# Patient Record
Sex: Male | Born: 1935 | Race: White | Hispanic: No | Marital: Married | Smoking: Former smoker
Health system: Southern US, Community
[De-identification: ages and names within clinical notes are randomized; demographics above are authoritative.]

## PROBLEM LIST (undated history)

## (undated) DIAGNOSIS — G4730 Sleep apnea, unspecified: Secondary | ICD-10-CM

## (undated) DIAGNOSIS — K469 Unspecified abdominal hernia without obstruction or gangrene: Secondary | ICD-10-CM

## (undated) DIAGNOSIS — R413 Other amnesia: Secondary | ICD-10-CM

## (undated) DIAGNOSIS — H269 Unspecified cataract: Secondary | ICD-10-CM

## (undated) DIAGNOSIS — K635 Polyp of colon: Secondary | ICD-10-CM

## (undated) DIAGNOSIS — N400 Enlarged prostate without lower urinary tract symptoms: Secondary | ICD-10-CM

## (undated) HISTORY — PX: UPPER GASTROINTESTINAL ENDOSCOPY: SHX188

## (undated) HISTORY — DX: Unspecified cataract: H26.9

## (undated) HISTORY — PX: POLYPECTOMY: SHX149

## (undated) HISTORY — PX: EYE SURGERY: SHX253

## (undated) HISTORY — DX: Polyp of colon: K63.5

## (undated) HISTORY — PX: LIPOMA EXCISION: SHX5283

## (undated) HISTORY — PX: COLONOSCOPY: SHX174

## (undated) HISTORY — PX: OTHER SURGICAL HISTORY: SHX169

## (undated) HISTORY — DX: Other amnesia: R41.3

## (undated) HISTORY — PX: TONSILLECTOMY: SUR1361

---

## 2006-04-04 ENCOUNTER — Encounter: Admission: RE | Admit: 2006-04-04 | Discharge: 2006-04-04 | Payer: Self-pay | Admitting: Internal Medicine

## 2008-02-02 ENCOUNTER — Ambulatory Visit (HOSPITAL_COMMUNITY): Admission: RE | Admit: 2008-02-02 | Discharge: 2008-02-02 | Payer: Self-pay | Admitting: *Deleted

## 2009-03-31 ENCOUNTER — Encounter: Admission: RE | Admit: 2009-03-31 | Discharge: 2009-03-31 | Payer: Self-pay | Admitting: Internal Medicine

## 2010-10-17 NOTE — Op Note (Signed)
NAME:  Gregory Lamb, Gregory Lamb NO.:  [id number]   MEDICAL RECORD NO.:  [id number]          PATIENT TYPE:  AMB   LOCATION:  ENDO                         FACILITY:  [facility]   PHYSICIAN:  Georgiana Spinner, M.D.    DATE OF BIRTH:  [birth year]   DATE OF PROCEDURE:  DATE OF DISCHARGE:                               OPERATIVE REPORT   Mr. Gregory Lamb is a 77 year old gentleman undergoing colonoscopy for follow-  up of colon polyps.   ANESTHESIA:  Fentanyl 75 mcg, Versed 6 mg.   PROCEDURE:  With the patient mildly sedated in the left lateral  decubitus position the Pentax videoscopic colonoscope was inserted in  the rectum after a normal rectal exam was performed and passed under  direct vision to the cecum, identified by ileocecal valve and  appendiceal orifice.    Dictation ended at this point.           ______________________________  Georgiana Spinner, M.D.     GMO/MEDQ  D:  02/02/2008  T:  02/02/2008  Job:  [id number]

## 2010-10-20 NOTE — Op Note (Signed)
NAME:  Gregory Lamb, Gregory Lamb NO.:  [id number]   MEDICAL RECORD NO.:  [id number]          PATIENT TYPE:  AMB   LOCATION:  ENDO                         FACILITY:  [facility]   PHYSICIAN:  Georgiana Spinner, M.D.    DATE OF BIRTH:  12/04/1933   DATE OF PROCEDURE:  DATE OF DISCHARGE:  02/02/2008                               OPERATIVE REPORT   PROCEDURE:  Colonoscopy.   INDICATIONS:  Colon polyps.   ANESTHESIA:  Fentanyl 75 mcg, Versed 6 mg.   PROCEDURE:  With the patient mildly sedated in the left lateral  decubitus position, the Pentax videoscopic colonoscope was inserted in  the rectum after normal rectal exam was performed, passed under direct  vision to the cecum identified by ileocecal valve and appendiceal  orifice.  From this point the colonoscope was slowly withdrawn taking  circumferential views of colonic mucosa stopping in the rectum which  appeared normal on direct and showed hemorrhoids on retroflexed view.  The endoscope was straightened and withdrawn.  The patient's vital signs  and pulse oximeter remained stable.  The patient tolerated procedure  well without apparent complications.   FINDINGS:  Internal hemorrhoids, otherwise an unremarkable examination.   PLAN:  Have patient follow-up with me as needed.           ______________________________  Georgiana Spinner, M.D.     GMO/MEDQ  D:  02/20/2008  T:  02/23/2008  Job:  [id number]

## 2010-12-27 ENCOUNTER — Ambulatory Visit
Admission: RE | Admit: 2010-12-27 | Discharge: 2010-12-27 | Disposition: A | Discharge status: Home / Self Care | Payer: Medicare Other | Source: Ambulatory Visit | Attending: Internal Medicine | Admitting: Internal Medicine

## 2010-12-27 ENCOUNTER — Other Ambulatory Visit: Payer: Self-pay | Admitting: Internal Medicine

## 2010-12-27 DIAGNOSIS — N50812 Left testicular pain: Secondary | ICD-10-CM

## 2011-11-26 ENCOUNTER — Other Ambulatory Visit: Payer: Self-pay | Admitting: Urology

## 2011-12-04 ENCOUNTER — Encounter (HOSPITAL_COMMUNITY): Payer: Self-pay | Admitting: Pharmacy Technician

## 2011-12-10 ENCOUNTER — Encounter (HOSPITAL_COMMUNITY): Payer: Self-pay

## 2011-12-10 ENCOUNTER — Encounter (HOSPITAL_COMMUNITY)
Admission: RE | Admit: 2011-12-10 | Discharge: 2011-12-10 | Disposition: A | Discharge status: Home / Self Care | Payer: Medicare Other | Source: Ambulatory Visit | Attending: Urology | Admitting: Urology

## 2011-12-10 ENCOUNTER — Ambulatory Visit (HOSPITAL_COMMUNITY)
Admission: RE | Admit: 2011-12-10 | Discharge: 2011-12-10 | Disposition: A | Discharge status: Home / Self Care | Payer: Medicare Other | Source: Ambulatory Visit | Attending: Urology | Admitting: Urology

## 2011-12-10 DIAGNOSIS — Z01818 Encounter for other preprocedural examination: Secondary | ICD-10-CM | POA: Insufficient documentation

## 2011-12-10 DIAGNOSIS — G4733 Obstructive sleep apnea (adult) (pediatric): Secondary | ICD-10-CM | POA: Insufficient documentation

## 2011-12-10 DIAGNOSIS — E119 Type 2 diabetes mellitus without complications: Secondary | ICD-10-CM | POA: Insufficient documentation

## 2011-12-10 HISTORY — DX: Unspecified abdominal hernia without obstruction or gangrene: K46.9

## 2011-12-10 HISTORY — DX: Benign prostatic hyperplasia without lower urinary tract symptoms: N40.0

## 2011-12-10 HISTORY — DX: Sleep apnea, unspecified: G47.30

## 2011-12-10 LAB — SURGICAL PCR SCREEN: MRSA, PCR: NEGATIVE

## 2011-12-10 NOTE — Pre-Procedure Instructions (Signed)
ADDENDUM-   UNABLE TO OBTAIN CORRECT EKG FROM Dr Odetta Pink office- pt states one was done but report sent with his birthday on it does not match his name. To be done AM of surgery

## 2011-12-10 NOTE — Patient Instructions (Addendum)
20 JEFFEY JANSSEN  12/10/2011   Your procedure is scheduled on:  12/12/11  Wednesday  Surgery [id number]  Report to [facility] at     0825  AM.  Call this number if you have problems the morning of surgery: [phone]     Or PST   [id number]  Shayaan Parke BRING CPAP NASAL PRONGS WITH YOU TO HOSPITAL  Remember: NO BLOOD SUGAR MEDICINE MORNING OF SURGERY  Do not eat food OR drink any fluid :After Midnight. Tuesday NIGHT      Take these medicines the morning of surgery with A SIP OF WATER:none   Do not wear jewelry, make-up or nail polish.  Do not wear lotions, powders, or perfumes. You may wear deodorant.  Do not shave 48 hours prior to surgery.  Do not bring valuables to the hospital.  Contacts, dentures or bridgework may not be worn into surgery.  Leave suitcase in the car. After surgery it may be brought to your room.  For patients admitted to the hospital, checkout time is 11:00 AM the day of discharge.   Patients discharged the day of surgery will not be allowed to drive home.  Name and phone number of your driver:                                                                      Special Instructions: CHG Shower Use Special Wash: 1/2 bottle night before surgery and 1/2 bottle morning of surgery. REGULAR SOAP FACE AND PRIVATES                        MEN-MAY SHAVE FACE MORNING OF SURGERY  Please read over the following fact sheets that you were given: MRSA Information

## 2011-12-10 NOTE — Pre-Procedure Instructions (Signed)
Labs- CBC, CMET from 11/29/11 on chart,  EKG 12/04/11 on chart

## 2011-12-12 ENCOUNTER — Encounter (HOSPITAL_COMMUNITY): Payer: Self-pay | Admitting: Anesthesiology

## 2011-12-12 ENCOUNTER — Ambulatory Visit (HOSPITAL_COMMUNITY): Payer: Medicare Other | Admitting: Anesthesiology

## 2011-12-12 ENCOUNTER — Encounter (HOSPITAL_COMMUNITY): Payer: Self-pay | Admitting: *Deleted

## 2011-12-12 ENCOUNTER — Ambulatory Visit (HOSPITAL_COMMUNITY)
Admission: RE | Admit: 2011-12-12 | Discharge: 2011-12-13 | Disposition: A | Discharge status: Home / Self Care | Payer: Medicare Other | Source: Ambulatory Visit | Attending: Urology | Admitting: Urology

## 2011-12-12 ENCOUNTER — Encounter (HOSPITAL_COMMUNITY)
Admission: RE | Disposition: A | Discharge status: Home / Self Care | Payer: Self-pay | Source: Ambulatory Visit | Attending: Urology

## 2011-12-12 DIAGNOSIS — E119 Type 2 diabetes mellitus without complications: Secondary | ICD-10-CM | POA: Insufficient documentation

## 2011-12-12 DIAGNOSIS — N138 Other obstructive and reflux uropathy: Secondary | ICD-10-CM | POA: Insufficient documentation

## 2011-12-12 DIAGNOSIS — Z79899 Other long term (current) drug therapy: Secondary | ICD-10-CM | POA: Insufficient documentation

## 2011-12-12 DIAGNOSIS — Z01812 Encounter for preprocedural laboratory examination: Secondary | ICD-10-CM | POA: Insufficient documentation

## 2011-12-12 DIAGNOSIS — Z01810 Encounter for preprocedural cardiovascular examination: Secondary | ICD-10-CM | POA: Insufficient documentation

## 2011-12-12 DIAGNOSIS — G4730 Sleep apnea, unspecified: Secondary | ICD-10-CM | POA: Insufficient documentation

## 2011-12-12 DIAGNOSIS — N401 Enlarged prostate with lower urinary tract symptoms: Secondary | ICD-10-CM | POA: Insufficient documentation

## 2011-12-12 DIAGNOSIS — N320 Bladder-neck obstruction: Secondary | ICD-10-CM | POA: Insufficient documentation

## 2011-12-12 HISTORY — PX: TRANSURETHRAL RESECTION OF PROSTATE: SHX73

## 2011-12-12 SURGERY — TRANSURETHRAL RESECTION OF THE PROSTATE WITH GYRUS INSTRUMENTS
Anesthesia: General | Wound class: Clean Contaminated

## 2011-12-12 MED ORDER — LIDOCAINE HCL 2 % EX GEL
CUTANEOUS | Status: AC
  Filled 2011-12-12: qty 10

## 2011-12-12 MED ORDER — BELLADONNA ALKALOIDS-OPIUM 16.2-60 MG RE SUPP: 1.0000 | Freq: Four times a day (QID) | RECTAL | Status: DC | PRN

## 2011-12-12 MED ORDER — FENTANYL CITRATE 0.05 MG/ML IJ SOLN
INTRAMUSCULAR | Status: DC | PRN
  Administered 2011-12-12 (×3): 50 ug via INTRAVENOUS

## 2011-12-12 MED ORDER — ACETAMINOPHEN 10 MG/ML IV SOLN
INTRAVENOUS | Status: DC | PRN
  Administered 2011-12-12: 1000 mg via INTRAVENOUS

## 2011-12-12 MED ORDER — ACETAMINOPHEN 10 MG/ML IV SOLN
INTRAVENOUS | Status: AC
  Filled 2011-12-12: qty 100

## 2011-12-12 MED ORDER — LACTATED RINGERS IV SOLN
INTRAVENOUS | Status: DC | PRN
  Administered 2011-12-12: 11:00:00 via INTRAVENOUS

## 2011-12-12 MED ORDER — FENTANYL CITRATE 0.05 MG/ML IJ SOLN: INTRAMUSCULAR | Status: DC | PRN

## 2011-12-12 MED ORDER — FLUTICASONE PROPIONATE 50 MCG/ACT NA SUSP
2.0000 | Freq: Every day | NASAL | Status: DC | PRN
  Filled 2011-12-12: qty 16

## 2011-12-12 MED ORDER — BELLADONNA ALKALOIDS-OPIUM 16.2-60 MG RE SUPP
RECTAL | Status: DC | PRN
  Administered 2011-12-12: 1 via RECTAL

## 2011-12-12 MED ORDER — PROPOFOL 10 MG/ML IV BOLUS
INTRAVENOUS | Status: DC | PRN
  Administered 2011-12-12: 200 mg via INTRAVENOUS

## 2011-12-12 MED ORDER — SODIUM CHLORIDE 0.45 % IV SOLN
INTRAVENOUS | Status: DC
  Administered 2011-12-12 – 2011-12-13 (×2): via INTRAVENOUS

## 2011-12-12 MED ORDER — PROMETHAZINE HCL 25 MG/ML IJ SOLN: 6.2500 mg | INTRAMUSCULAR | Status: DC | PRN

## 2011-12-12 MED ORDER — TEMAZEPAM 15 MG PO CAPS: 15.0000 mg | ORAL_CAPSULE | Freq: Every evening | ORAL | Status: DC | PRN

## 2011-12-12 MED ORDER — CIPROFLOXACIN IN D5W 400 MG/200ML IV SOLN
400.0000 mg | INTRAVENOUS | Status: AC
  Administered 2011-12-12: 400 mg via INTRAVENOUS

## 2011-12-12 MED ORDER — SODIUM CHLORIDE 0.9 % IR SOLN
Status: DC | PRN
  Administered 2011-12-12: 3000 mL via INTRAVESICAL

## 2011-12-12 MED ORDER — TAMSULOSIN HCL 0.4 MG PO CAPS
0.4000 mg | ORAL_CAPSULE | Freq: Every day | ORAL | Status: DC
  Administered 2011-12-12: 0.4 mg via ORAL
  Filled 2011-12-12 (×2): qty 1

## 2011-12-12 MED ORDER — CIPROFLOXACIN HCL 250 MG PO TABS
250.0000 mg | ORAL_TABLET | Freq: Two times a day (BID) | ORAL | Status: DC
  Administered 2011-12-12 – 2011-12-13 (×2): 250 mg via ORAL
  Filled 2011-12-12 (×4): qty 1

## 2011-12-12 MED ORDER — SIMVASTATIN 40 MG PO TABS
40.0000 mg | ORAL_TABLET | Freq: Every day | ORAL | Status: DC
  Administered 2011-12-12: 40 mg via ORAL
  Filled 2011-12-12 (×2): qty 1

## 2011-12-12 MED ORDER — CIPROFLOXACIN IN D5W 400 MG/200ML IV SOLN
INTRAVENOUS | Status: AC
  Filled 2011-12-12: qty 200

## 2011-12-12 MED ORDER — METFORMIN HCL 500 MG PO TABS
500.0000 mg | ORAL_TABLET | Freq: Two times a day (BID) | ORAL | Status: DC
  Administered 2011-12-12 – 2011-12-13 (×2): 500 mg via ORAL
  Filled 2011-12-12 (×4): qty 1

## 2011-12-12 MED ORDER — LACTATED RINGERS IV SOLN
INTRAVENOUS | Status: DC
  Administered 2011-12-12: 1000 mL via INTRAVENOUS

## 2011-12-12 MED ORDER — HYDROCODONE-ACETAMINOPHEN 5-325 MG PO TABS: 1.0000 | ORAL_TABLET | ORAL | Status: DC | PRN

## 2011-12-12 MED ORDER — DOCUSATE SODIUM 100 MG PO CAPS
100.0000 mg | ORAL_CAPSULE | Freq: Two times a day (BID) | ORAL | Status: DC
  Administered 2011-12-12: 100 mg via ORAL
  Filled 2011-12-12 (×3): qty 1

## 2011-12-12 MED ORDER — HYDROMORPHONE HCL PF 1 MG/ML IJ SOLN: 0.2500 mg | INTRAMUSCULAR | Status: DC | PRN

## 2011-12-12 MED ORDER — ONDANSETRON HCL 4 MG/2ML IJ SOLN: 4.0000 mg | INTRAMUSCULAR | Status: DC | PRN

## 2011-12-12 MED ORDER — LIDOCAINE HCL (CARDIAC) 20 MG/ML IV SOLN
INTRAVENOUS | Status: DC | PRN
  Administered 2011-12-12: 80 mg via INTRAVENOUS

## 2011-12-12 MED ORDER — LIDOCAINE HCL (CARDIAC) 20 MG/ML IV SOLN: INTRAVENOUS | Status: DC | PRN

## 2011-12-12 MED ORDER — DIPHENHYDRAMINE HCL 25 MG PO CAPS: 25.0000 mg | ORAL_CAPSULE | Freq: Every day | ORAL | Status: DC | PRN

## 2011-12-12 MED ORDER — DIPHENHYDRAMINE HCL 25 MG PO TABS: 25.0000 mg | ORAL_TABLET | Freq: Every day | ORAL | Status: DC | PRN

## 2011-12-12 MED ORDER — LACTATED RINGERS IV SOLN: INTRAVENOUS | Status: DC

## 2011-12-12 MED ORDER — BELLADONNA ALKALOIDS-OPIUM 16.2-60 MG RE SUPP
RECTAL | Status: AC
  Filled 2011-12-12: qty 1

## 2011-12-12 SURGICAL SUPPLY — 25 items
BAG URINE DRAINAGE (UROLOGICAL SUPPLIES) ×2 IMPLANT
BAG URO CATCHER STRL LF (DRAPE) ×2 IMPLANT
BLADE SURG 15 STRL LF DISP TIS (BLADE) IMPLANT
BLADE SURG 15 STRL SS (BLADE)
CATH FOLEY 3WAY 30CC 22FR (CATHETERS) ×2 IMPLANT
CLOTH BEACON ORANGE TIMEOUT ST (SAFETY) ×2 IMPLANT
DRAPE CAMERA CLOSED 9X96 (DRAPES) ×2 IMPLANT
ELECT BUTTON HF 24-28F 2 30DE (ELECTRODE) IMPLANT
ELECT HF RESECT BIPO 24F 45 ND (CUTTING LOOP) IMPLANT
ELECT LOOP MED HF 24F 12D (CUTTING LOOP) IMPLANT
ELECT LOOP MED HF 24F 12D CBL (CLIP) ×2 IMPLANT
ELECT REM PT RETURN 9FT ADLT (ELECTROSURGICAL)
ELECTRODE REM PT RTRN 9FT ADLT (ELECTROSURGICAL) IMPLANT
EVACUATOR MICROVAS BLADDER (UROLOGICAL SUPPLIES) ×2 IMPLANT
GLOVE BIOGEL M 8.0 STRL (GLOVE) ×2 IMPLANT
GOWN PREVENTION PLUS XLARGE (GOWN DISPOSABLE) IMPLANT
GOWN STRL REIN XL XLG (GOWN DISPOSABLE) ×2 IMPLANT
HOLDER FOLEY CATH W/STRAP (MISCELLANEOUS) ×2 IMPLANT
IV NS IRRIG 3000ML ARTHROMATIC (IV SOLUTION) ×4 IMPLANT
KIT ASPIRATION TUBING (SET/KITS/TRAYS/PACK) ×2 IMPLANT
MANIFOLD NEPTUNE II (INSTRUMENTS) ×2 IMPLANT
PACK CYSTO (CUSTOM PROCEDURE TRAY) ×2 IMPLANT
SUT ETHILON 3 0 PS 1 (SUTURE) IMPLANT
SYR 30ML LL (SYRINGE) ×2 IMPLANT
TUBING CONNECTING 10 (TUBING) ×2 IMPLANT

## 2011-12-12 NOTE — Progress Notes (Signed)
Post-op note  Subjective: The patient is doing well.  No complaints.  Objective: Vital signs in last 24 hours: Temp:  [97.1 F (36.2 C)-98.1 F (36.7 C)] 97.5 F (36.4 C) (07/10 1328) Pulse Rate:  [53-78] 60  (07/10 1328) Resp:  [11-18] 12  (07/10 1328) BP: (138-161)/(71-87) 161/80 mmHg (07/10 1328) SpO2:  [96 %-100 %] 98 % (07/10 1328) Weight:  [80.287 kg (177 lb)] 80.287 kg (177 lb) (07/10 1337)  Intake/Output from previous day:   Intake/Output this shift: Total I/O In: 3800 [I.V.:800; Other:3000] Out: 3650 [Urine:3650]  Physical Exam:  General: Alert and oriented.   Urine clear.  Lab Results: No results found for this basename: HGB:3,HCT:3 in the last 72 hours  Assessment/Plan: POD#0   1) Continue to monitor   Bertram Millard. Adasyn Mcadams, MD   LOS: 0 days   Chelsea Aus 12/12/2011, 6:26 PM

## 2011-12-12 NOTE — Op Note (Signed)
Preoperative diagnosis: 1. Bladder outlet obstruction secondary to BPH  Postoperative diagnosis:  1. Bladder outlet obstruction secondary to BPH  Procedure:  1. Cystoscopy 2. Transurethral resectionof the prostate  Surgeon: Bertram Millard. Ella Golomb, M.D.  Anesthesia: General  Complications: None  Drain: Foley catheter  EBL: Minimal  Specimens: 1. Prostate chips  Disposition of specimens: Pathology  Indication: Gregory Lamb is a patient with bladder outlet obstruction secondary to benign prostatic hyperplasia. After reviewing the management options for treatment, he elected to proceed with the above surgical procedure(s). We have discussed the potential benefits and risks of the procedure, side effects of the proposed treatment, the likelihood of the patient achieving the goals of the procedure, and any potential problems that might occur during the procedure or recuperation. Informed consent has been obtained.  Description of procedure:  The patient was identified in the holding area.He received preoperative antibiotics. He was then taken to the operating room. General anesthetic was administered.  The patient was then placed in the dorsal lithotomy position, prepped and draped in the usual sterile fashion. Timeout was then performed.  A resectoscope sheath was placed using the obturator, and the resectoscope, loop and telescope were placed.  The bladder was then systematically examined in its entirety. There was no evidence of  tumors, stones, or other mucosal pathology.  The ureteral orifices were identified and marked so as to be avoided during the procedure.  The prostate adenoma was then resected utilizing loop cautery resection with the bipolar cutting loop.  The prostate adenoma from the bladder neck back to the verumontanum was resected beginning at the six o'clock position and then extended to include the right and left lobes of the prostate and anterior prostate,  respectively.. Care was taken not to resect distal to the verumontanum.  Hemostasis was then achieved with the cautery and the bladder was emptied and reinspected with no significant bleeding noted at the end of the procedure.    A 3 way catheter was then placed into the bladder and placed on continuous bladder irrigation.  The patient appeared to tolerate the procedure well and without complications.  The patient was able to be awakened and transferred to the recovery unit in satisfactory condition.

## 2011-12-12 NOTE — H&P (Signed)
Urology History and Physical Exam  CC: Prostate problems  HPI: 78 year old male presents for elective TUR-P. His history is as follows:    He was originally sent by Dr. Juline Patch in May 2037for evaluation and management of urinary issues. The patient has been treated for a large prostate for several years, and has been on maximal medical therapy for about 2 years. This includes Avodart and tamsulosin. Despite this, he still has significant urinary symptomatology, including a feeling of incomplete emptying, frequency, intermittency, urgency, weak stream, straining and nocturia. AUA BPH symptom score is 31 with his quality of life score 5 he desires further management. This urinary situation impacts him on a daily basis. He denies any history of frequent urinary tract infections. He denies any gross hematuria. He does have erectile dysfunction, and has been on Viagra. He has seen a urologist before years ago for "a strain".   PSA was 0.4 in March, 2013. Corrected for Avodart, it is 0.8.   Cystoscopy and flow rate performed in our office in June revealed prostatic obstruction.     PMH: Past Medical History  Diagnosis Date  . Sleep apnea     last study 5 years ago- per patient [facility]  . Diabetes mellitus   . Hernia     abdominal/ventral  . BPH (benign prostatic hypertrophy)     PSH: Past Surgical History  Procedure Date  . Tonsillectomy   . Lipoma excision     back  . Eye surgery     cataract extraction with IOL    Allergies: No Known Allergies  Medications: No prescriptions prior to admission     Social History: History   Social History  . Marital Status: Married    Spouse Name: N/A    Number of Children: N/A  . Years of Education: N/A   Occupational History  . Not on file.   Social History Main Topics  . Smoking status: Former Smoker -- 1.0 packs/day for 50 years    Types: Cigarettes    Quit date: 12/10/1979  . Smokeless tobacco: Never Used  . Alcohol  Use: Yes     daily glass wine or beer  . Drug Use: No  . Sexually Active: Not on file   Other Topics Concern  . Not on file   Social History Narrative  . No narrative on file    Family History: No family history on file.  Review of Systems: Genitourinary, constitutional, skin, eye, otolaryngeal, hematologic/lymphatic, cardiovascular, pulmonary, endocrine, musculoskeletal, gastrointestinal, neurological and psychiatric system(s) were reviewed and pertinent findings if present are noted.  Genitourinary: urinary frequency, feelings of urinary urgency, nocturia, difficulty starting the urinary stream, weak urinary stream, urinary stream starts and stops, incomplete emptying of bladder, erectile dysfunction and initiating urination requires straining.  Gastrointestinal: constipation.  ENT: sinus problems.  Musculoskeletal: join Physical Exam: @VITALS2 @ Constitutional: Well nourished and well developed . No acute distress.  ENT:. The ears and nose are normal in appearance.  Neck: The appearance of the neck is normal and no neck mass is present.  Pulmonary: No respiratory distress and normal respiratory rhythm and effort.  Cardiovascular: Heart rate and rhythm are normal . No peripheral edema.  Abdomen: The abdomen is rounded. The abdomen is soft and nontender. No masses are palpated. No CVA tenderness. No hernias are palpable. No hepatosplenomegaly noted.  Rectal: Rectal exam demonstrates normal sphincter tone, the anus is normal on inspection., no tenderness and no masses. Estimated prostate size is 2+. Normal  rectal tone, no rectal masses, prostate is smooth, symmetric and non-tender. The prostate has no nodularity and is not tender. The left seminal vesicle is nonpalpable. The right seminal vesicle is nonpalpable. The perineum is normal on inspection.  Genitourinary: Examination of the penis demonstrates no discharge, no masses, no lesions and a normal meatus. The penis is circumcised. The  scrotum is without lesions. The right epididymis is palpably normal and non-tender. The left epididymis is palpably normal and non-tender. The right spermatic cord is palpably normal. The left spermatic cord is palpably normal. The right testis is palpably normal, non-tender and without masses. The left testis is normal, non-tender and without masses.  Lymphatics: The femoral and inguinal nodes are not enlarged or tender.  Skin: Normal skin turgor, no visible rash and no visible skin lesions.  Studies:  No results found for this basename: HGB:2,WBC:2,PLT:2 in the last 72 hours  No results found for this basename: NA:2,K:2,CL:2,CO2:2,BUN:2,CREATININE:2,CALCIUM:2,MAGNESIUM:2,GFRNONAA:2,GFRAA:2 in the last 72 hours   No results found for this basename: PT:2,INR:2,APTT:2 in the last 72 hours   No components found with this basename: ABG:2    Assessment:  BPH with significant symptomatology  Plan: TUR-P

## 2011-12-12 NOTE — Anesthesia Preprocedure Evaluation (Signed)
Anesthesia Evaluation  Patient identified by MRN, date of birth, ID band Patient awake    Reviewed: Allergy & Precautions, H&P , NPO status , Patient's Chart, lab work & pertinent test results, reviewed documented beta blocker date and time   Airway Mallampati: II TM Distance: >3 FB Neck ROM: full    Dental No notable dental hx.    Pulmonary neg pulmonary ROS, sleep apnea ,  breath sounds clear to auscultation  Pulmonary exam normal       Cardiovascular Exercise Tolerance: Good negative cardio ROS  Rhythm:regular Rate:Normal     Neuro/Psych negative neurological ROS  negative psych ROS   GI/Hepatic negative GI ROS, Neg liver ROS,   Endo/Other    Renal/GU negative Renal ROS  negative genitourinary   Musculoskeletal   Abdominal   Peds  Hematology negative hematology ROS (+)   Anesthesia Other Findings   Reproductive/Obstetrics negative OB ROS                           Anesthesia Physical Anesthesia Plan  ASA: III  Anesthesia Plan: General   Post-op Pain Management:    Induction: Intravenous  Airway Management Planned: LMA  Additional Equipment:   Intra-op Plan:   Post-operative Plan: Extubation in OR  Informed Consent: I have reviewed the patients History and Physical, chart, labs and discussed the procedure including the risks, benefits and alternatives for the proposed anesthesia with the patient or authorized representative who has indicated his/her understanding and acceptance.   Dental Advisory Given  Plan Discussed with: CRNA  Anesthesia Plan Comments:         Anesthesia Quick Evaluation

## 2011-12-12 NOTE — Transfer of Care (Signed)
Immediate Anesthesia Transfer of Care Note  Patient: Gregory Lamb  Procedure(s) Performed: Procedure(s) (LRB): TRANSURETHRAL RESECTION OF THE PROSTATE WITH GYRUS INSTRUMENTS (N/A)  Patient Location: PACU  Anesthesia Type: General  Level of Consciousness: sedated, patient cooperative and responds to stimulaton  Airway & Oxygen Therapy: Patient Spontanous Breathing and Patient connected to face mask oxgen  Post-op Assessment: Report given to PACU RN and Post -op Vital signs reviewed and stable  Post vital signs: Reviewed and stable  Complications: No apparent anesthesia complications

## 2011-12-13 ENCOUNTER — Encounter (HOSPITAL_COMMUNITY): Payer: Self-pay | Admitting: Urology

## 2011-12-13 MED ORDER — TAMSULOSIN HCL 0.4 MG PO CAPS: 0.4000 mg | ORAL_CAPSULE | Freq: Every day | ORAL | Status: AC

## 2011-12-13 MED ORDER — CIPROFLOXACIN HCL 250 MG PO TABS: 250.0000 mg | ORAL_TABLET | Freq: Two times a day (BID) | ORAL | Status: AC

## 2011-12-13 NOTE — Discharge Summary (Signed)
Patient ID: Gregory Lamb MRN: [id number] DOB/AGE: 01-16-1934 78 y.o.  Admit date: 12/12/2011 Discharge date: 12/13/2011  Primary Care Physician:  No primary provider on file.  Discharge Diagnoses:   BPH Consults:  None    Discharge Medications: Medication List  As of 12/13/2011  7:15 AM   ASK your doctor about these medications         aspirin EC 81 MG tablet   Take 81 mg by mouth every evening.      diphenhydrAMINE 25 MG tablet   Commonly known as: BENADRYL   Take 25 mg by mouth daily as needed. Allergies      dutasteride 0.5 MG capsule   Commonly known as: AVODART   Take 0.5 mg by mouth daily before breakfast.      Fish Oil 1200 MG Caps   Take 1,200 mg by mouth daily.      fluticasone 50 MCG/ACT nasal spray   Commonly known as: FLONASE   Place 2 sprays into the nose daily as needed. allergies      metFORMIN 500 MG tablet   Commonly known as: GLUCOPHAGE   Take 500 mg by mouth 2 (two) times daily with a meal.      multivitamin with minerals Tabs   Take 1 tablet by mouth daily.      sildenafil 100 MG tablet   Commonly known as: VIAGRA   Take 100 mg by mouth daily as needed. Sexual disorder      simvastatin 40 MG tablet   Commonly known as: ZOCOR   Take 40 mg by mouth at bedtime.      Tamsulosin HCl 0.4 MG Caps   Commonly known as: FLOMAX   Take 0.4 mg by mouth daily after supper.      temazepam 15 MG capsule   Commonly known as: RESTORIL   Take 15 mg by mouth at bedtime as needed. Sleep             Significant Diagnostic Studies:  Dg Chest 2 View  12/10/2011  *RADIOLOGY REPORT*  Clinical Data: 78 year old male preoperative study for prostate surgery.  Diabetes, sleep apnea.  CHEST - 2 VIEW  Comparison: None.  Findings: Lung volumes are within normal limits.  Cardiac size and mediastinal contours are within normal limits.  Visualized tracheal air column is within normal limits.  No pneumothorax, pulmonary edema or pleural effusion.  Mild streaky and  linear perihilar opacity probably is chronic scarring.  No confluent pulmonary opacity.  No pulmonary nodule or mass identified. No acute osseous abnormality identified.  IMPRESSION: No acute cardiopulmonary abnormality.  Original Report Authenticated By: Harley Hallmark, M.D.    Brief H and P: For complete details please refer to admission H and P, but in brief the patient is admitted for TURP for treatment of BPH. The patient has been on maximal medical therapy with significant symptomatology  Hospital Course:  The patient was admitted following TURP. He had an uncomplicated postoperative course and was discharged in improved condition. He voided adequately.  Day of Discharge BP 141/75  Pulse 82  Temp 98.4 F (36.9 C) (Oral)  Resp 18  Ht 5\' 8"  (1.727 m)  Wt 80.287 kg (177 lb)  BMI 26.91 kg/m2  SpO2 93%  Results for orders placed during the hospital encounter of 12/12/11 (from the past 24 hour(s))  GLUCOSE, CAPILLARY     Status: Abnormal   Collection Time   12/12/11  8:06 AM      Component Value  Range   Glucose-Capillary 130 (*) 70 - 99 mg/dL  GLUCOSE, CAPILLARY     Status: Normal   Collection Time   12/12/11  6:11 PM      Component Value Range   Glucose-Capillary 99  70 - 99 mg/dL    Physical Exam: General: Alert and awake oriented x3 not in any acute distress. HEENT: anicteric sclera, pupils reactive to light and accommodation CVS: S1-S2 clear no murmur rubs or gallops Chest: clear to auscultation bilaterally, no wheezing rales or rhonchi Abdomen: soft nontender, nondistended, normal bowel sounds, no organomegaly Extremities: no cyanosis, clubbing or edema noted bilaterally Neuro: Cranial nerves II-XII intact, no focal neurological deficits  Disposition: Home  Diet: No restrictions  Activity: Discussed with patient   Disposition and Follow-up:   He will followup in 3-4 weeks with Dr. Retta Diones  TESTS THAT NEED FOLLOW-UP Pathology will be reviewed  DISCHARGE  FOLLOW-UP Follow-up Information    Follow up with Tillman Kazmierski, Bertram Millard, MD. (As scheduled)    Contact information:   [address] [place] [address] [phone]          Time spent on Discharge: 10 minutes  Signed: Chelsea Aus 12/13/2011, 7:15 AM

## 2011-12-13 NOTE — Anesthesia Postprocedure Evaluation (Signed)
  Anesthesia Post-op Note  Patient: Gregory Lamb  Procedure(s) Performed: Procedure(s) (LRB): TRANSURETHRAL RESECTION OF THE PROSTATE WITH GYRUS INSTRUMENTS (N/A)  Patient Location: PACU  Anesthesia Type: General  Level of Consciousness: awake and alert   Airway and Oxygen Therapy: Patient Spontanous Breathing  Post-op Pain: mild  Post-op Assessment: Post-op Vital signs reviewed, Patient's Cardiovascular Status Stable, Respiratory Function Stable, Patent Airway and No signs of Nausea or vomiting  Post-op Vital Signs: stable  Complications: No apparent anesthesia complications

## 2012-04-07 ENCOUNTER — Other Ambulatory Visit: Payer: Self-pay | Admitting: Internal Medicine

## 2012-04-07 DIAGNOSIS — R1310 Dysphagia, unspecified: Secondary | ICD-10-CM

## 2012-04-18 ENCOUNTER — Ambulatory Visit
Admission: RE | Admit: 2012-04-18 | Discharge: 2012-04-18 | Disposition: A | Discharge status: Home / Self Care | Payer: Medicare Other | Source: Ambulatory Visit | Attending: Internal Medicine | Admitting: Internal Medicine

## 2012-04-18 DIAGNOSIS — R1310 Dysphagia, unspecified: Secondary | ICD-10-CM

## 2013-07-08 ENCOUNTER — Other Ambulatory Visit: Payer: Self-pay | Admitting: Dermatology

## 2014-06-08 DIAGNOSIS — E118 Type 2 diabetes mellitus with unspecified complications: Secondary | ICD-10-CM | POA: Diagnosis not present

## 2014-06-08 DIAGNOSIS — E119 Type 2 diabetes mellitus without complications: Secondary | ICD-10-CM | POA: Diagnosis not present

## 2014-06-14 DIAGNOSIS — Z23 Encounter for immunization: Secondary | ICD-10-CM | POA: Diagnosis not present

## 2014-06-14 DIAGNOSIS — Z0000 Encounter for general adult medical examination without abnormal findings: Secondary | ICD-10-CM | POA: Diagnosis not present

## 2014-06-14 DIAGNOSIS — E1165 Type 2 diabetes mellitus with hyperglycemia: Secondary | ICD-10-CM | POA: Diagnosis not present

## 2014-06-14 DIAGNOSIS — Z1389 Encounter for screening for other disorder: Secondary | ICD-10-CM | POA: Diagnosis not present

## 2014-07-16 DIAGNOSIS — H5203 Hypermetropia, bilateral: Secondary | ICD-10-CM | POA: Diagnosis not present

## 2014-08-16 DIAGNOSIS — G4733 Obstructive sleep apnea (adult) (pediatric): Secondary | ICD-10-CM | POA: Diagnosis not present

## 2014-09-06 DIAGNOSIS — D225 Melanocytic nevi of trunk: Secondary | ICD-10-CM | POA: Diagnosis not present

## 2014-09-06 DIAGNOSIS — L821 Other seborrheic keratosis: Secondary | ICD-10-CM | POA: Diagnosis not present

## 2014-09-06 DIAGNOSIS — Z85828 Personal history of other malignant neoplasm of skin: Secondary | ICD-10-CM | POA: Diagnosis not present

## 2014-09-06 DIAGNOSIS — D224 Melanocytic nevi of scalp and neck: Secondary | ICD-10-CM | POA: Diagnosis not present

## 2014-10-15 DIAGNOSIS — E780 Pure hypercholesterolemia: Secondary | ICD-10-CM | POA: Diagnosis not present

## 2014-10-15 DIAGNOSIS — E119 Type 2 diabetes mellitus without complications: Secondary | ICD-10-CM | POA: Diagnosis not present

## 2016-04-09 DIAGNOSIS — E291 Testicular hypofunction: Secondary | ICD-10-CM | POA: Diagnosis not present

## 2016-04-21 DIAGNOSIS — G4733 Obstructive sleep apnea (adult) (pediatric): Secondary | ICD-10-CM | POA: Diagnosis not present

## 2016-05-02 DIAGNOSIS — N401 Enlarged prostate with lower urinary tract symptoms: Secondary | ICD-10-CM | POA: Diagnosis not present

## 2016-05-21 DIAGNOSIS — G4733 Obstructive sleep apnea (adult) (pediatric): Secondary | ICD-10-CM | POA: Diagnosis not present

## 2016-06-12 DIAGNOSIS — G4733 Obstructive sleep apnea (adult) (pediatric): Secondary | ICD-10-CM | POA: Diagnosis not present

## 2016-06-19 DIAGNOSIS — G4733 Obstructive sleep apnea (adult) (pediatric): Secondary | ICD-10-CM | POA: Diagnosis not present

## 2016-06-21 DIAGNOSIS — G4733 Obstructive sleep apnea (adult) (pediatric): Secondary | ICD-10-CM | POA: Diagnosis not present

## 2016-06-22 DIAGNOSIS — G4733 Obstructive sleep apnea (adult) (pediatric): Secondary | ICD-10-CM | POA: Diagnosis not present

## 2016-07-02 DIAGNOSIS — E1165 Type 2 diabetes mellitus with hyperglycemia: Secondary | ICD-10-CM | POA: Diagnosis not present

## 2016-07-02 DIAGNOSIS — E291 Testicular hypofunction: Secondary | ICD-10-CM | POA: Diagnosis not present

## 2016-07-02 DIAGNOSIS — I10 Essential (primary) hypertension: Secondary | ICD-10-CM | POA: Diagnosis not present

## 2016-07-06 DIAGNOSIS — E1165 Type 2 diabetes mellitus with hyperglycemia: Secondary | ICD-10-CM | POA: Diagnosis not present

## 2016-07-06 DIAGNOSIS — Z0000 Encounter for general adult medical examination without abnormal findings: Secondary | ICD-10-CM | POA: Diagnosis not present

## 2016-07-06 DIAGNOSIS — I10 Essential (primary) hypertension: Secondary | ICD-10-CM | POA: Diagnosis not present

## 2016-07-06 DIAGNOSIS — E7800 Pure hypercholesterolemia, unspecified: Secondary | ICD-10-CM | POA: Diagnosis not present

## 2016-07-11 DIAGNOSIS — E291 Testicular hypofunction: Secondary | ICD-10-CM | POA: Diagnosis not present

## 2016-07-22 DIAGNOSIS — G4733 Obstructive sleep apnea (adult) (pediatric): Secondary | ICD-10-CM | POA: Diagnosis not present

## 2016-07-23 DIAGNOSIS — H43812 Vitreous degeneration, left eye: Secondary | ICD-10-CM | POA: Diagnosis not present

## 2016-07-23 DIAGNOSIS — H01001 Unspecified blepharitis right upper eyelid: Secondary | ICD-10-CM | POA: Diagnosis not present

## 2016-07-23 DIAGNOSIS — H01004 Unspecified blepharitis left upper eyelid: Secondary | ICD-10-CM | POA: Diagnosis not present

## 2016-07-23 DIAGNOSIS — E119 Type 2 diabetes mellitus without complications: Secondary | ICD-10-CM | POA: Diagnosis not present

## 2016-07-23 DIAGNOSIS — H524 Presbyopia: Secondary | ICD-10-CM | POA: Diagnosis not present

## 2016-07-31 DIAGNOSIS — G4733 Obstructive sleep apnea (adult) (pediatric): Secondary | ICD-10-CM | POA: Diagnosis not present

## 2016-08-01 DIAGNOSIS — E291 Testicular hypofunction: Secondary | ICD-10-CM | POA: Diagnosis not present

## 2016-08-19 DIAGNOSIS — G4733 Obstructive sleep apnea (adult) (pediatric): Secondary | ICD-10-CM | POA: Diagnosis not present

## 2016-09-03 DIAGNOSIS — Z85828 Personal history of other malignant neoplasm of skin: Secondary | ICD-10-CM | POA: Diagnosis not present

## 2016-09-03 DIAGNOSIS — D485 Neoplasm of uncertain behavior of skin: Secondary | ICD-10-CM | POA: Diagnosis not present

## 2016-09-03 DIAGNOSIS — D1801 Hemangioma of skin and subcutaneous tissue: Secondary | ICD-10-CM | POA: Diagnosis not present

## 2016-09-03 DIAGNOSIS — D225 Melanocytic nevi of trunk: Secondary | ICD-10-CM | POA: Diagnosis not present

## 2016-09-03 DIAGNOSIS — L821 Other seborrheic keratosis: Secondary | ICD-10-CM | POA: Diagnosis not present

## 2016-09-03 DIAGNOSIS — L570 Actinic keratosis: Secondary | ICD-10-CM | POA: Diagnosis not present

## 2016-09-19 DIAGNOSIS — G4733 Obstructive sleep apnea (adult) (pediatric): Secondary | ICD-10-CM | POA: Diagnosis not present

## 2016-09-23 DIAGNOSIS — G4733 Obstructive sleep apnea (adult) (pediatric): Secondary | ICD-10-CM | POA: Diagnosis not present

## 2016-10-01 DIAGNOSIS — L570 Actinic keratosis: Secondary | ICD-10-CM | POA: Diagnosis not present

## 2016-10-02 DIAGNOSIS — E291 Testicular hypofunction: Secondary | ICD-10-CM | POA: Diagnosis not present

## 2016-10-02 DIAGNOSIS — Z0000 Encounter for general adult medical examination without abnormal findings: Secondary | ICD-10-CM | POA: Diagnosis not present

## 2016-10-02 DIAGNOSIS — I10 Essential (primary) hypertension: Secondary | ICD-10-CM | POA: Diagnosis not present

## 2016-10-02 DIAGNOSIS — Z125 Encounter for screening for malignant neoplasm of prostate: Secondary | ICD-10-CM | POA: Diagnosis not present

## 2016-10-02 DIAGNOSIS — E1165 Type 2 diabetes mellitus with hyperglycemia: Secondary | ICD-10-CM | POA: Diagnosis not present

## 2016-10-08 DIAGNOSIS — R413 Other amnesia: Secondary | ICD-10-CM | POA: Diagnosis not present

## 2016-10-08 DIAGNOSIS — E291 Testicular hypofunction: Secondary | ICD-10-CM | POA: Diagnosis not present

## 2016-10-10 ENCOUNTER — Other Ambulatory Visit: Payer: Self-pay | Admitting: Internal Medicine

## 2016-10-10 DIAGNOSIS — R413 Other amnesia: Secondary | ICD-10-CM

## 2016-10-15 ENCOUNTER — Ambulatory Visit
Admission: RE | Admit: 2016-10-15 | Discharge: 2016-10-15 | Disposition: A | Discharge status: Home / Self Care | Payer: Medicare Other | Source: Ambulatory Visit | Attending: Internal Medicine | Admitting: Internal Medicine

## 2016-10-15 DIAGNOSIS — R413 Other amnesia: Secondary | ICD-10-CM

## 2016-10-19 DIAGNOSIS — G4733 Obstructive sleep apnea (adult) (pediatric): Secondary | ICD-10-CM | POA: Diagnosis not present

## 2016-11-01 DIAGNOSIS — G4733 Obstructive sleep apnea (adult) (pediatric): Secondary | ICD-10-CM | POA: Diagnosis not present

## 2016-11-06 DIAGNOSIS — R413 Other amnesia: Secondary | ICD-10-CM | POA: Diagnosis not present

## 2016-11-06 DIAGNOSIS — E1165 Type 2 diabetes mellitus with hyperglycemia: Secondary | ICD-10-CM | POA: Diagnosis not present

## 2016-11-06 DIAGNOSIS — E7800 Pure hypercholesterolemia, unspecified: Secondary | ICD-10-CM | POA: Diagnosis not present

## 2016-11-19 DIAGNOSIS — G4733 Obstructive sleep apnea (adult) (pediatric): Secondary | ICD-10-CM | POA: Diagnosis not present

## 2016-12-17 DIAGNOSIS — E1165 Type 2 diabetes mellitus with hyperglycemia: Secondary | ICD-10-CM | POA: Diagnosis not present

## 2016-12-17 DIAGNOSIS — E7800 Pure hypercholesterolemia, unspecified: Secondary | ICD-10-CM | POA: Diagnosis not present

## 2016-12-17 DIAGNOSIS — I10 Essential (primary) hypertension: Secondary | ICD-10-CM | POA: Diagnosis not present

## 2016-12-17 DIAGNOSIS — R413 Other amnesia: Secondary | ICD-10-CM | POA: Diagnosis not present

## 2016-12-19 DIAGNOSIS — G4733 Obstructive sleep apnea (adult) (pediatric): Secondary | ICD-10-CM | POA: Diagnosis not present

## 2017-01-19 DIAGNOSIS — G4733 Obstructive sleep apnea (adult) (pediatric): Secondary | ICD-10-CM | POA: Diagnosis not present

## 2017-02-02 DIAGNOSIS — G4733 Obstructive sleep apnea (adult) (pediatric): Secondary | ICD-10-CM | POA: Diagnosis not present

## 2017-02-19 DIAGNOSIS — G4733 Obstructive sleep apnea (adult) (pediatric): Secondary | ICD-10-CM | POA: Diagnosis not present

## 2017-03-21 DIAGNOSIS — G4733 Obstructive sleep apnea (adult) (pediatric): Secondary | ICD-10-CM | POA: Diagnosis not present

## 2017-04-01 DIAGNOSIS — R413 Other amnesia: Secondary | ICD-10-CM | POA: Diagnosis not present

## 2017-04-01 DIAGNOSIS — I10 Essential (primary) hypertension: Secondary | ICD-10-CM | POA: Diagnosis not present

## 2017-04-01 DIAGNOSIS — E291 Testicular hypofunction: Secondary | ICD-10-CM | POA: Diagnosis not present

## 2017-04-01 DIAGNOSIS — E1165 Type 2 diabetes mellitus with hyperglycemia: Secondary | ICD-10-CM | POA: Diagnosis not present

## 2017-04-08 DIAGNOSIS — E291 Testicular hypofunction: Secondary | ICD-10-CM | POA: Diagnosis not present

## 2017-04-08 DIAGNOSIS — I10 Essential (primary) hypertension: Secondary | ICD-10-CM | POA: Diagnosis not present

## 2017-04-08 DIAGNOSIS — R05 Cough: Secondary | ICD-10-CM | POA: Diagnosis not present

## 2017-04-08 DIAGNOSIS — Z0000 Encounter for general adult medical examination without abnormal findings: Secondary | ICD-10-CM | POA: Diagnosis not present

## 2017-04-08 DIAGNOSIS — E1165 Type 2 diabetes mellitus with hyperglycemia: Secondary | ICD-10-CM | POA: Diagnosis not present

## 2017-05-06 DIAGNOSIS — G4733 Obstructive sleep apnea (adult) (pediatric): Secondary | ICD-10-CM | POA: Diagnosis not present

## 2017-05-17 DIAGNOSIS — N401 Enlarged prostate with lower urinary tract symptoms: Secondary | ICD-10-CM | POA: Diagnosis not present

## 2017-06-03 ENCOUNTER — Encounter: Payer: Self-pay | Admitting: Gastroenterology

## 2017-07-02 DIAGNOSIS — Z125 Encounter for screening for malignant neoplasm of prostate: Secondary | ICD-10-CM | POA: Diagnosis not present

## 2017-07-02 DIAGNOSIS — I10 Essential (primary) hypertension: Secondary | ICD-10-CM | POA: Diagnosis not present

## 2017-07-02 DIAGNOSIS — E291 Testicular hypofunction: Secondary | ICD-10-CM | POA: Diagnosis not present

## 2017-07-02 DIAGNOSIS — E1165 Type 2 diabetes mellitus with hyperglycemia: Secondary | ICD-10-CM | POA: Diagnosis not present

## 2017-07-02 DIAGNOSIS — Z0000 Encounter for general adult medical examination without abnormal findings: Secondary | ICD-10-CM | POA: Diagnosis not present

## 2017-07-12 ENCOUNTER — Encounter: Payer: Self-pay | Admitting: Gastroenterology

## 2017-07-12 ENCOUNTER — Ambulatory Visit: Payer: Medicare Other | Admitting: Gastroenterology

## 2017-07-12 VITALS — BP 124/72 | HR 70 | Ht 66.0 in | Wt 170.5 lb

## 2017-07-12 DIAGNOSIS — R1310 Dysphagia, unspecified: Secondary | ICD-10-CM | POA: Diagnosis not present

## 2017-07-12 NOTE — Progress Notes (Signed)
Gregory Lamb    [id number]    [birth year]  Primary Care Physician:Kim, Jeneen Rinks, MD  Referring Physician: Jani Gravel, MD [address] [place], [place] [address]  Chief complaint:  Dysphagia HPI: 84 year old male here with complaints dysphagia, progressively worsening the past 2-3 years.  He feels the food gets hung up with almost every meal and sometimes he regurgitates it back.  Last month he had an episode of food impaction, lasted about 40-45 minutes until he regurgitated it all back.  Denies any odynophagia, nausea, abdominal pain, change in bowel habits, melena or blood per rectum.  No EGD or colonoscopy. No family history of colon cancer.  Other relevant medical history includes diabetes, hyperlipidemia and obstructive sleep apnea.    Outpatient Encounter Medications as of 07/12/2017  Medication Sig  . diphenhydrAMINE (BENADRYL) 25 MG tablet Take 25 mg by mouth daily as needed. Allergies  . fluticasone (FLONASE) 50 MCG/ACT nasal spray Place 2 sprays into the nose daily as needed. allergies  . metFORMIN (GLUCOPHAGE) 500 MG tablet Take 500 mg by mouth 2 (two) times daily with a meal.  . Multiple Vitamin (MULTIVITAMIN WITH MINERALS) TABS Take 1 tablet by mouth daily.  . Omega-3 Fatty Acids (FISH OIL) 1200 MG CAPS Take 1,200 mg by mouth daily.  . simvastatin (ZOCOR) 40 MG tablet Take 40 mg by mouth at bedtime.  . Tamsulosin HCl (FLOMAX) 0.4 MG CAPS Take 1 capsule (0.4 mg total) by mouth daily after supper.  . temazepam (RESTORIL) 15 MG capsule Take 15 mg by mouth at bedtime as needed. Sleep  . [DISCONTINUED] sildenafil (VIAGRA) 100 MG tablet Take 100 mg by mouth daily as needed. Sexual disorder   No facility-administered encounter medications on file as of 07/12/2017.     Allergies as of 07/12/2017  . (No Known Allergies)    Past Medical History:  Diagnosis Date  . BPH (benign prostatic hypertrophy)   . Diabetes mellitus   . Hernia    abdominal/ventral  . Sleep apnea    last study 5 years ago- per patient [facility]    Past Surgical History:  Procedure Laterality Date  . EYE SURGERY     cataract extraction with IOL  . LIPOMA EXCISION     back  . TONSILLECTOMY    . TRANSURETHRAL RESECTION OF PROSTATE  12/12/2011   Procedure: TRANSURETHRAL RESECTION OF THE PROSTATE WITH GYRUS INSTRUMENTS;  Surgeon: Franchot Gallo, MD;  Location: [facility];  Service: Urology;  Laterality: N/A;    No family history on file.  Social History   Socioeconomic History  . Marital status: Married    Spouse name: Not on file  . Number of children: Not on file  . Years of education: Not on file  . Highest education level: Not on file  Social Needs  . Financial resource strain: Not on file  . Food insecurity - worry: Not on file  . Food insecurity - inability: Not on file  . Transportation needs - medical: Not on file  . Transportation needs - non-medical: Not on file  Occupational History  . Not on file  Tobacco Use  . Smoking status: Former Smoker    Packs/day: 1.00    Years: 50.00    Pack years: 50.00    Types: Cigarettes    Last attempt to quit: 12/10/1979    Years since quitting: 37.6  . Smokeless tobacco: Never Used  Substance and Sexual Activity  .  Alcohol use: Yes    Alcohol/week: 4.2 oz    Types: 7 Cans of beer per week    Comment: daily glass wine or beer  . Drug use: No  . Sexual activity: Not on file  Other Topics Concern  . Not on file  Social History Narrative  . Not on file      Review of systems: Review of Systems  Constitutional: Negative for fever and chills.  HENT: Positive for sinus problem   Eyes: Negative for blurred vision.  Respiratory: Negative for cough, shortness of breath and wheezing.   Cardiovascular: Negative for chest pain and palpitations.  Gastrointestinal: as per HPI Genitourinary: Negative for dysuria, urgency, frequency and hematuria.  Musculoskeletal: Negative for myalgias, back  pain and joint pain.  Skin: Negative for itching and rash.  Neurological: Negative for dizziness, tremors, focal weakness, seizures and loss of consciousness.  Endo/Heme/Allergies: Positive for seasonal allergies.  Psychiatric/Behavioral: Negative for depression, suicidal ideas and hallucinations.  All other systems reviewed and are negative.   Physical Exam: Vitals:   07/12/17 0958  BP: 124/72  Pulse: 70   Body mass index is 25.92 kg/m. Gen:      No acute distress HEENT:  EOMI, sclera anicteric Neck:     No masses; no thyromegaly Lungs:    Clear to auscultation bilaterally; normal respiratory effort CV:         Regular rate and rhythm; no murmurs Abd:      + bowel sounds; soft, non-tender; no palpable masses, no distension Ext:    No edema; adequate peripheral perfusion Skin:      Warm and dry; no rash Neuro: alert and oriented x 3 Psych: normal mood and affect  Data Reviewed:  Reviewed labs, radiology imaging, old records and pertinent past GI work up  Upper GI series 04/18/2012: Findings: A preliminary film of the abdomen shows a nonspecific bowel gas pattern.  No opaque calculi are noted.  A double contrast upper GI was performed.  The mucosa of the esophagus is unremarkable.  A single contrast study shows the swallowing mechanism to be normal.  Esophageal peristalsis is normal.  There is a small hiatal hernia present.  Mild gastroesophageal reflux is present.  A barium pill was given at the end of the study which delay in passage at the level of the hiatal hernia, but did pass into the stomach intact.  The stomach is normal in contour and peristalsis.  The duodenal bulb fills and the duodenal loop is in normal position.  IMPRESSION:  1.  Small hiatal hernia.  Mild gastroesophageal reflux.  Barium pill delays slightly in passing into the stomach but does pass intact. 2.  Otherwise the stomach and duodenum appear normal.  Assessment and  Plan/Recommendations:  84 year old male with complaints of solid food dysphagia. He had an episode of food impaction last month. Schedule for EGD for evaluation and possible dilation Advised him to eat soft diet until EGD, and avoid meat, tough bread and vegetables Discussed antireflux measures and lifestyle modification The risks and benefits as well as alternatives of endoscopic procedure(s) have been discussed and reviewed. All questions answered. The patient agrees to proceed.    Damaris Hippo , MD [phone] Mon-Fri 8a-5p [phone] after 5p, weekends, holidays  CC: Jani Gravel, MD

## 2017-07-12 NOTE — Patient Instructions (Addendum)
If you are age 82 or older, your body mass index should be between 23-30. Your Body mass index is 25.92 kg/m. If this is out of the aforementioned range listed, please consider follow up with your Primary Care Provider.  If you are age 52 or younger, your body mass index should be between 19-25. Your Body mass index is 25.92 kg/m. If this is out of the aformentioned range listed, please consider follow up with your Primary Care Provider.   You have been scheduled for an endoscopy. Please follow written instructions given to you at your visit today. If you use inhalers (even only as needed), please bring them with you on the day of your procedure. Your physician has requested that you go to www.startemmi.com and enter the access code given to you at your visit today. This web site gives a general overview about your procedure. However, you should still follow specific instructions given to you by our office regarding your preparation for the procedure.  Avoid meat, bread, and raw vegetables.  Soft diet.  You have been given anti-reflux measures.  Thank you for choosing me and [facility].   Pat Kocher, MD Soft-Food Meal Plan A soft-food meal plan includes foods that are safe and easy to swallow. This meal plan typically is used:  If you are having trouble chewing or swallowing foods.  As a transition meal plan after only having had liquid meals for a long period.  What do I need to know about the soft-food meal plan? A soft-food meal plan includes tender foods that are soft and easy to chew and swallow. In most cases, bite-sized pieces of food are easier to swallow. A bite-sized piece is about  inch or smaller. Foods in this plan do not need to be ground or pureed. Foods that are very hard, crunchy, or sticky should be avoided. Also, breads, cereals, yogurts, and desserts with nuts, seeds, or fruits should be avoided. What foods can I eat? Grains Rice and wild rice. Moist  bread, dressing, pasta, and noodles. Well-moistened dry or cooked cereals, such as farina (cooked wheat cereal), oatmeal, or grits. Biscuits, breads, muffins, pancakes, and waffles that have been well moistened. Vegetables Shredded lettuce. Cooked, tender vegetables, including potatoes without skins. Vegetable juices. Broths or creamed soups made with vegetables that are not stringy or chewy. Strained tomatoes (without seeds). Fruits Canned or well-cooked fruits. Soft (ripe), peeled fresh fruits, such as peaches, nectarines, kiwi, cantaloupe, honeydew melon, and watermelon (without seeds). Soft berries with small seeds, such as strawberries. Fruit juices (without pulp). Meats and Other Protein Sources Moist, tender, lean beef. Mutton. Lamb. Veal. Chicken. [place]. Liver. Ham. Fish without bones. Eggs. Dairy Milk, milk drinks, and cream. Plain cream cheese and cottage cheese. Plain yogurt. Sweets/Desserts Flavored gelatin desserts. Custard. Plain ice cream, frozen yogurt, sherbet, milk shakes, and malts. Plain cakes and cookies. Plain hard candy. Other Butter, margarine (without trans fat), and cooking oils. Mayonnaise. Cream sauces. Mild spices, salt, and sugar. Syrup, molasses, honey, and jelly. The items listed above may not be a complete list of recommended foods or beverages. Contact your dietitian for more options. What foods are not recommended? Grains Dry bread, toast, crackers that have not been moistened. Coarse or dry cereals, such as bran, granola, and shredded wheat. Tough or chewy crusty breads, such as [place] bread or baguettes. Vegetables Corn. Raw vegetables except shredded lettuce. Cooked vegetables that are tough or stringy. Tough, crisp, fried potatoes and potato skins. Fruits Fresh fruits with  skins or seeds or both, such as apples, pears, or grapes. Stringy, high-pulp fruits, such as papaya, pineapple, coconut, or mango. Fruit leather, fruit roll-ups, and all dried  fruits. Meats and Other Protein Sources Sausages and hot dogs. Meats with gristle. Fish with bones. Nuts, seeds, and chunky peanut or other nut butters. Sweets/Desserts Cakes or cookies that are very dry or chewy. The items listed above may not be a complete list of foods and beverages to avoid. Contact your dietitian for more information. This information is not intended to replace advice given to you by your health care provider. Make sure you discuss any questions you have with your health care provider. Document Released: 08/28/2007 Document Revised: 10/27/2015 Document Reviewed: 04/17/2013 Elsevier Interactive Patient Education  2017 [employer].

## 2017-07-17 ENCOUNTER — Ambulatory Visit (AMBULATORY_SURGERY_CENTER): Payer: Medicare Other | Admitting: Gastroenterology

## 2017-07-17 ENCOUNTER — Encounter: Payer: Self-pay | Admitting: Gastroenterology

## 2017-07-17 ENCOUNTER — Other Ambulatory Visit: Payer: Self-pay

## 2017-07-17 VITALS — BP 143/88 | HR 53 | Temp 96.6°F | Resp 18 | Ht 66.0 in | Wt 179.0 lb

## 2017-07-17 DIAGNOSIS — K2970 Gastritis, unspecified, without bleeding: Secondary | ICD-10-CM

## 2017-07-17 DIAGNOSIS — K2990 Gastroduodenitis, unspecified, without bleeding: Secondary | ICD-10-CM

## 2017-07-17 DIAGNOSIS — R1310 Dysphagia, unspecified: Secondary | ICD-10-CM

## 2017-07-17 DIAGNOSIS — K2980 Duodenitis without bleeding: Secondary | ICD-10-CM | POA: Diagnosis not present

## 2017-07-17 DIAGNOSIS — K2210 Ulcer of esophagus without bleeding: Secondary | ICD-10-CM | POA: Diagnosis not present

## 2017-07-17 DIAGNOSIS — K209 Esophagitis, unspecified without bleeding: Secondary | ICD-10-CM

## 2017-07-17 DIAGNOSIS — K2971 Gastritis, unspecified, with bleeding: Secondary | ICD-10-CM | POA: Diagnosis not present

## 2017-07-17 MED ORDER — OMEPRAZOLE 40 MG PO CPDR: DELAYED_RELEASE_CAPSULE | ORAL | 3 refills | Status: DC

## 2017-07-17 MED ORDER — SODIUM CHLORIDE 0.9 % IV SOLN: 500.0000 mL | Freq: Once | INTRAVENOUS | Status: DC

## 2017-07-17 MED ORDER — OMEPRAZOLE 40 MG PO CPDR: DELAYED_RELEASE_CAPSULE | ORAL | 2 refills | Status: DC

## 2017-07-17 NOTE — Progress Notes (Signed)
.  erev

## 2017-07-17 NOTE — Patient Instructions (Signed)
Discharge instructions given. Handouts on esophagitis and gastritis. Resume previous medications. Prescription sent to pharmacy. Office visit scheduled. YOU HAD AN ENDOSCOPIC PROCEDURE TODAY AT [facility]:   Refer to the procedure report that was given to you for any specific questions about what was found during the examination.  If the procedure report does not answer your questions, please call your gastroenterologist to clarify.  If you requested that your care partner not be given the details of your procedure findings, then the procedure report has been included in a sealed envelope for you to review at your convenience later.  YOU SHOULD EXPECT: Some feelings of bloating in the abdomen. Passage of more gas than usual.  Walking can help get rid of the air that was put into your GI tract during the procedure and reduce the bloating. If you had a lower endoscopy (such as a colonoscopy or flexible sigmoidoscopy) you may notice spotting of blood in your stool or on the toilet paper. If you underwent a bowel prep for your procedure, you may not have a normal bowel movement for a few days.  Please Note:  You might notice some irritation and congestion in your nose or some drainage.  This is from the oxygen used during your procedure.  There is no need for concern and it should clear up in a day or so.  SYMPTOMS TO REPORT IMMEDIATELY:    Following upper endoscopy (EGD)  Vomiting of blood or coffee ground material  New chest pain or pain under the shoulder blades  Painful or persistently difficult swallowing  New shortness of breath  Fever of 100F or higher  Black, tarry-looking stools  For urgent or emergent issues, a gastroenterologist can be reached at any hour by calling [phone].   DIET:  We do recommend a small meal at first, but then you may proceed to your regular diet.  Drink plenty of fluids but you should avoid alcoholic beverages for 24 hours.  ACTIVITY:   You should plan to take it easy for the rest of today and you should NOT DRIVE or use heavy machinery until tomorrow (because of the sedation medicines used during the test).    FOLLOW UP: Our staff will call the number listed on your records the next business day following your procedure to check on you and address any questions or concerns that you may have regarding the information given to you following your procedure. If we do not reach you, we will leave a message.  However, if you are feeling well and you are not experiencing any problems, there is no need to return our call.  We will assume that you have returned to your regular daily activities without incident.  If any biopsies were taken you will be contacted by phone or by letter within the next 1-3 weeks.  Please call us at [phone] if you have not heard about the biopsies in 3 weeks.    SIGNATURES/CONFIDENTIALITY: You and/or your care partner have signed paperwork which will be entered into your electronic medical record.  These signatures attest to the fact that that the information above on your After Visit Summary has been reviewed and is understood.  Full responsibility of the confidentiality of this discharge information lies with you and/or your care-partner.

## 2017-07-17 NOTE — Progress Notes (Signed)
A and O x3. Report to RN. Tolerated MAC anesthesia well.Teeth unchanged after procedure.

## 2017-07-17 NOTE — Op Note (Signed)
[facility] Patient Name: Zaviyar Rahal Procedure Date: 07/17/2017 10:04 AM MRN: [id number] Endoscopist: Mauri Pole , MD Age: 84 Referring MD:  Date of Birth: Oct 10, 1933 Gender: Male Account #: [id number] Procedure:                Upper GI endoscopy Indications:              Dysphagia Medicines:                Monitored Anesthesia Care Procedure:                Pre-Anesthesia Assessment:                           - Prior to the procedure, a History and Physical                            was performed, and patient medications and                            allergies were reviewed. The patient's tolerance of                            previous anesthesia was also reviewed. The risks                            and benefits of the procedure and the sedation                            options and risks were discussed with the patient.                            All questions were answered, and informed consent                            was obtained. Prior Anticoagulants: The patient has                            taken no previous anticoagulant or antiplatelet                            agents. ASA Grade Assessment: III - A patient with                            severe systemic disease. After reviewing the risks                            and benefits, the patient was deemed in                            satisfactory condition to undergo the procedure.                           After obtaining informed consent, the endoscope was  passed under direct vision. Throughout the                            procedure, the patient's blood pressure, pulse, and                            oxygen saturations were monitored continuously. The                            Endoscope was introduced through the mouth, and                            advanced to the second part of duodenum. The upper                            GI endoscopy was accomplished without  difficulty.                            The patient tolerated the procedure well. Scope In: Scope Out: Findings:                 Two cratered esophageal ulcers with no bleeding and                            no stigmata of recent bleeding were found 38 to 39                            cm from the incisors. The largest lesion was less                            than one mm in largest dimension. Biopsies were                            taken with a cold forceps for histology.                           LA Grade C (one or more mucosal breaks continuous                            between tops of 2 or more mucosal folds, less than                            75% circumference) esophagitis with no bleeding was                            found 38 to 40 cm from the incisors. Biopsies were                            taken with a cold forceps for histology.                           One mild (non-circumferential scarring)  benign-appearing, intrinsic stenosis was found 38                            to 39 cm from the incisors. This measured 1.8 cm                            (inner diameter) x less than one cm (in length) and                            was traversed.                           Patchy moderate inflammation with hemorrhage                            characterized by congestion (edema), erosions and                            erythema was found in the gastric antrum. Biopsies                            were taken with a cold forceps for Helicobacter                            pylori testing.                           Patchy moderately erythematous mucosa without                            active bleeding and with no stigmata of bleeding                            was found in the duodenal bulb.                           The second portion of the duodenum was normal. Complications:            No immediate complications. Estimated Blood Loss:     Estimated blood  loss was minimal. Impression:               - Non-bleeding esophageal ulcers. Biopsied.                           - LA Grade C erosive esophagitis. Biopsied.                           - Benign-appearing esophageal stenosis.                           - Gastritis with hemorrhage. Biopsied.                           - Erythematous duodenopathy.                           -  Normal second portion of the duodenum. Recommendation:           - Patient has a contact number available for                            emergencies. The signs and symptoms of potential                            delayed complications were discussed with the                            patient. Return to normal activities tomorrow.                            Written discharge instructions were provided to the                            patient.                           - Resume previous diet.                           - Continue present medications.                           - Await pathology results.                           - Follow an antireflux regimen for the rest of the                            patient's life.                           - Use Prilosec (omeprazole) 40 mg PO BID for 2                            months followed by Prilosec 40mg  daily. Mauri Pole, MD 07/17/2017 10:21:05 AM This report has been signed electronically.

## 2017-07-17 NOTE — Progress Notes (Signed)
Called to room to assist during endoscopic procedure.  Patient ID and intended procedure confirmed with present staff. Received instructions for my participation in the procedure from the performing physician.  

## 2017-07-18 ENCOUNTER — Telehealth: Payer: Self-pay

## 2017-07-18 NOTE — Telephone Encounter (Signed)
  Follow up Call-  Call back number 07/17/2017  Post procedure Call Back phone  # [phone]  Permission to leave phone message Yes  Some recent data might be hidden     Patient questions:  Do you have a fever, pain , or abdominal swelling? No. Pain Score  0 *  Have you tolerated food without any problems? Yes.    Have you been able to return to your normal activities? Yes.    Do you have any questions about your discharge instructions: Diet   No. Medications  No. Follow up visit  No.  Do you have questions or concerns about your Care? No.  Actions: * If pain score is 4 or above: No action needed, pain <4.

## 2017-07-23 ENCOUNTER — Encounter: Payer: Self-pay | Admitting: Gastroenterology

## 2017-07-26 DIAGNOSIS — H01004 Unspecified blepharitis left upper eyelid: Secondary | ICD-10-CM | POA: Diagnosis not present

## 2017-07-26 DIAGNOSIS — H524 Presbyopia: Secondary | ICD-10-CM | POA: Diagnosis not present

## 2017-07-26 DIAGNOSIS — E119 Type 2 diabetes mellitus without complications: Secondary | ICD-10-CM | POA: Diagnosis not present

## 2017-07-26 DIAGNOSIS — H43812 Vitreous degeneration, left eye: Secondary | ICD-10-CM | POA: Diagnosis not present

## 2017-07-26 DIAGNOSIS — H01001 Unspecified blepharitis right upper eyelid: Secondary | ICD-10-CM | POA: Diagnosis not present

## 2017-08-07 DIAGNOSIS — G4733 Obstructive sleep apnea (adult) (pediatric): Secondary | ICD-10-CM | POA: Diagnosis not present

## 2017-09-02 DIAGNOSIS — L821 Other seborrheic keratosis: Secondary | ICD-10-CM | POA: Diagnosis not present

## 2017-09-02 DIAGNOSIS — L304 Erythema intertrigo: Secondary | ICD-10-CM | POA: Diagnosis not present

## 2017-09-02 DIAGNOSIS — D2239 Melanocytic nevi of other parts of face: Secondary | ICD-10-CM | POA: Diagnosis not present

## 2017-09-02 DIAGNOSIS — D225 Melanocytic nevi of trunk: Secondary | ICD-10-CM | POA: Diagnosis not present

## 2017-09-02 DIAGNOSIS — Z85828 Personal history of other malignant neoplasm of skin: Secondary | ICD-10-CM | POA: Diagnosis not present

## 2017-09-23 ENCOUNTER — Encounter: Payer: Self-pay | Admitting: Gastroenterology

## 2017-09-23 ENCOUNTER — Ambulatory Visit: Payer: Medicare Other | Admitting: Gastroenterology

## 2017-09-23 ENCOUNTER — Encounter (INDEPENDENT_AMBULATORY_CARE_PROVIDER_SITE_OTHER): Payer: Self-pay

## 2017-09-23 VITALS — BP 130/70 | HR 76 | Ht 68.0 in | Wt 171.0 lb

## 2017-09-23 DIAGNOSIS — K2210 Ulcer of esophagus without bleeding: Secondary | ICD-10-CM | POA: Diagnosis not present

## 2017-09-23 DIAGNOSIS — K219 Gastro-esophageal reflux disease without esophagitis: Secondary | ICD-10-CM | POA: Diagnosis not present

## 2017-09-23 MED ORDER — OMEPRAZOLE 40 MG PO CPDR
40.0000 mg | DELAYED_RELEASE_CAPSULE | Freq: Every day | ORAL | 3 refills | Status: DC
Start: 2017-09-23 — End: 2019-10-27

## 2017-09-23 NOTE — Patient Instructions (Signed)
You have been scheduled for an endoscopy. Please follow written instructions given to you at your visit today. If you use inhalers (even only as needed), please bring them with you on the day of your procedure.   We have sent omeprazole to [facility] today   Follow up in 6 months  If you are age 82 or older, your body mass index should be between 23-30. Your Body mass index is 26 kg/m. If this is out of the aforementioned range listed, please consider follow up with your Primary Care Provider.  If you are age 28 or younger, your body mass index should be between 19-25. Your Body mass index is 26 kg/m. If this is out of the aformentioned range listed, please consider follow up with your Primary Care Provider.

## 2017-09-23 NOTE — Progress Notes (Signed)
Gregory Lamb    [id number]    Jan 16, 1934  Primary Care Physician:Kim, Jeneen Rinks, MD  Referring Physician: Jani Gravel, MD [place], [place] [address]  Chief complaint:  GERD HPI: 84 year old male with history of GERD, was last seen in the office 2019 with complaints of dysphagia.  EGD July 17, 2017 showed cratered distal esophageal ulcers and severe erosive esophagitis.  He was started on omeprazole 40 mg twice daily, he has been taking it for the past 2 months.  Reports significant improvement of his symptoms and no longer has dysphagia.  His only complaint is coughing up mucus when he wakes up in the morning, he feels it pulling up on the left side of his throat and he has to cough up a dime sized mucus almost every morning.  Denies any nausea, vomiting, dysphagia, odynophagia, abdominal pain, change in bowel habits, melena or blood per rectum.  His appetite is normal and denies any weight loss   Outpatient Encounter Medications as of 09/23/2017  Medication Sig  . aspirin EC 81 MG tablet Take 81 mg by mouth daily.  Marland Kitchen azelastine (ASTELIN) 0.1 % nasal spray Place 1 spray into both nostrils 2 (two) times daily. Use in each nostril as directed  . diphenhydrAMINE (BENADRYL) 25 MG tablet Take 25 mg by mouth daily as needed. Allergies  . losartan (COZAAR) 25 MG tablet Take 25 mg by mouth daily. Takes 1/2 tablet daily  . Melatonin 3 MG CAPS Take 3 mg by mouth at bedtime.  . metFORMIN (GLUCOPHAGE) 500 MG tablet Take 500 mg by mouth 2 (two) times daily with a meal.  . Omega-3 Fatty Acids (FISH OIL) 1200 MG CAPS Take 1,200 mg by mouth daily.  Marland Kitchen omeprazole (PRILOSEC) 40 MG capsule Take 40mg  twice daily for 2 months  . Tamsulosin HCl (FLOMAX) 0.4 MG CAPS Take 1 capsule (0.4 mg total) by mouth daily after supper.  . temazepam (RESTORIL) 15 MG capsule Take 15 mg by mouth at bedtime as needed. Sleep  . testosterone cypionate (DEPOTESTOTERONE CYPIONATE) 100 MG/ML  injection Inject 200 mg into the muscle every 21 ( twenty-one) days. 0.23ml IM q 3 weeks. For IM use only  . [DISCONTINUED] omeprazole (PRILOSEC) 40 MG capsule Take 40 mg daily after 2 months of taking twice daily (Patient not taking: Reported on 09/23/2017)   Facility-Administered Encounter Medications as of 09/23/2017  Medication  . 0.9 %  sodium chloride infusion    Allergies as of 09/23/2017  . (No Known Allergies)    Past Medical History:  Diagnosis Date  . BPH (benign prostatic hypertrophy)   . Colon polyps   . Diabetes mellitus   . Hernia    abdominal/ventral  . Sleep apnea    last study 5 years ago- per patient [facility]    Past Surgical History:  Procedure Laterality Date  . EYE SURGERY     cataract extraction with IOL  . LIPOMA EXCISION     back  . TONSILLECTOMY    . TRANSURETHRAL RESECTION OF PROSTATE  12/12/2011   Procedure: TRANSURETHRAL RESECTION OF THE PROSTATE WITH GYRUS INSTRUMENTS;  Surgeon: Franchot Gallo, MD;  Location: [facility];  Service: Urology;  Laterality: N/A;    History reviewed. No pertinent family history.  Social History   Socioeconomic History  . Marital status: Married    Spouse name: Not on file  . Number of children: 4  . Years of education: Not  on file  . Highest education level: Not on file  Occupational History  . Not on file  Social Needs  . Financial resource strain: Not on file  . Food insecurity:    Worry: Not on file    Inability: Not on file  . Transportation needs:    Medical: Not on file    Non-medical: Not on file  Tobacco Use  . Smoking status: Former Smoker    Packs/day: 1.00    Years: 50.00    Pack years: 50.00    Types: Cigarettes    Last attempt to quit: 12/10/1979    Years since quitting: 37.8  . Smokeless tobacco: Never Used  Substance and Sexual Activity  . Alcohol use: Yes    Alcohol/week: 4.2 oz    Types: 7 Cans of beer per week    Comment: daily glass wine or beer  . Drug use: No  . Sexual activity:  Not on file  Lifestyle  . Physical activity:    Days per week: Not on file    Minutes per session: Not on file  . Stress: Not on file  Relationships  . Social connections:    Talks on phone: Not on file    Gets together: Not on file    Attends religious service: Not on file    Active member of club or organization: Not on file    Attends meetings of clubs or organizations: Not on file    Relationship status: Not on file  . Intimate partner violence:    Fear of current or ex partner: Not on file    Emotionally abused: Not on file    Physically abused: Not on file    Forced sexual activity: Not on file  Other Topics Concern  . Not on file  Social History Narrative  . Not on file      Review of systems: Review of Systems  Constitutional: Negative for fever and chills.  HENT: Negative.   Eyes: Negative for blurred vision.  Respiratory: Negative for cough, shortness of breath and wheezing.   Cardiovascular: Negative for chest pain and palpitations.  Gastrointestinal: as per HPI Genitourinary: Negative for dysuria, urgency, frequency and hematuria.  Musculoskeletal: Positive for myalgias, back pain and joint pain.  Skin: Negative for itching and rash.  Neurological: Negative for dizziness, tremors, focal weakness, seizures and loss of consciousness.  Endo/Heme/Allergies: Positive for seasonal allergies.  Psychiatric/Behavioral: Negative for depression, suicidal ideas and hallucinations.  All other systems reviewed and are negative.   Physical Exam: Vitals:   09/23/17 1436  BP: 130/70  Pulse: 76   Body mass index is 26 kg/m. Gen:      No acute distress HEENT:  EOMI, sclera anicteric Neck:     No masses; no thyromegaly Lungs:    Clear to auscultation bilaterally; normal respiratory effort CV:         Regular rate and rhythm; no murmurs Abd:      + bowel sounds; soft, non-tender; no palpable masses, no distension Ext:    No edema; adequate peripheral perfusion Skin:       Warm and dry; no rash Neuro: alert and oriented x 3 Psych: normal mood and affect  Data Reviewed:  Reviewed labs, radiology imaging, old records and pertinent past GI work up   Assessment and Plan/Recommendations:  84 year old male with history of GERD here for follow-up visit Last EGD February 2019 with findings of severe erosive esophagitis and created ulcers in the distal esophagus  Will repeat EGD to document healing of the ulcers, if still present will obtain biopsies to rule out dysplasia or malignancy continue omeprazole 40 mg daily, 30 minutes before breakfast Discussed in detail lifestyle modification and antireflux measures Return in 6 months or sooner if needed  The risks and benefits as well as alternatives of endoscopic procedure(s) have been discussed and reviewed. All questions answered. The patient agrees to proceed.    Damaris Hippo , MD [phone]    CC: Jani Gravel, MD

## 2017-09-25 ENCOUNTER — Encounter: Payer: Self-pay | Admitting: Gastroenterology

## 2017-09-25 ENCOUNTER — Ambulatory Visit (AMBULATORY_SURGERY_CENTER): Payer: Medicare Other | Admitting: Gastroenterology

## 2017-09-25 ENCOUNTER — Other Ambulatory Visit: Payer: Self-pay

## 2017-09-25 VITALS — BP 128/77 | HR 55 | Temp 96.8°F | Resp 13 | Ht 68.0 in | Wt 171.0 lb

## 2017-09-25 DIAGNOSIS — K219 Gastro-esophageal reflux disease without esophagitis: Secondary | ICD-10-CM | POA: Diagnosis not present

## 2017-09-25 DIAGNOSIS — K2210 Ulcer of esophagus without bleeding: Secondary | ICD-10-CM | POA: Diagnosis present

## 2017-09-25 MED ORDER — SODIUM CHLORIDE 0.9 % IV SOLN: 500.0000 mL | Freq: Once | INTRAVENOUS | Status: DC

## 2017-09-25 NOTE — Op Note (Signed)
[facility] Patient Name: Gregory Lamb Procedure Date: 09/25/2017 10:37 AM MRN: [id number] Endoscopist: Mauri Pole , MD Age: 84 Referring MD:  Date of Birth: 12-18-1933 Gender: Male Account #: [id number] Procedure:                Upper GI endoscopy Indications:              Follow-up of esophageal ulcer Medicines:                Monitored Anesthesia Care Procedure:                Pre-Anesthesia Assessment:                           - Prior to the procedure, a History and Physical                            was performed, and patient medications and                            allergies were reviewed. The patient's tolerance of                            previous anesthesia was also reviewed. The risks                            and benefits of the procedure and the sedation                            options and risks were discussed with the patient.                            All questions were answered, and informed consent                            was obtained. Prior Anticoagulants: The patient has                            taken no previous anticoagulant or antiplatelet                            agents. ASA Grade Assessment: II - A patient with                            mild systemic disease. After reviewing the risks                            and benefits, the patient was deemed in                            satisfactory condition to undergo the procedure.                           After obtaining informed consent, the endoscope was  passed under direct vision. Throughout the                            procedure, the patient's blood pressure, pulse, and                            oxygen saturations were monitored continuously. The                            Model GIF-[id number]) scope was introduced                            through the mouth, and advanced to the second part                            of duodenum. The  upper GI endoscopy was                            accomplished without difficulty. The patient                            tolerated the procedure well. Scope In: Scope Out: Findings:                 Esophagogastric landmarks were identified: the                            Z-line was found at 40 cm and the gastroesophageal                            junction was found at 42 cm from the incisors.                           A small hiatal hernia was present.                           The stomach was normal.                           The examined duodenum was normal. Complications:            No immediate complications. Estimated Blood Loss:     Estimated blood loss: none. Impression:               - Esophagogastric landmarks identified.                           - Small hiatal hernia.                           - Normal stomach.                           - Normal examined duodenum.                           - No specimens collected. Recommendation:           -  Patient has a contact number available for                            emergencies. The signs and symptoms of potential                            delayed complications were discussed with the                            patient. Return to normal activities tomorrow.                            Written discharge instructions were provided to the                            patient.                           - Resume previous diet.                           - Continue present medications.                           - No repeat upper endoscopy.                           - Use Prilosec (omeprazole) 40 mg PO daily.                           - Follow an antireflux regimen for the rest of the                            patient's life. Mauri Pole, MD 09/25/2017 10:51:21 AM This report has been signed electronically.

## 2017-09-25 NOTE — Patient Instructions (Signed)
*  Handouts given to patient on GERD protocal/ Hiatal hernia.  YOU HAD AN ENDOSCOPIC PROCEDURE TODAY AT [facility]:   Refer to the procedure report that was given to you for any specific questions about what was found during the examination.  If the procedure report does not answer your questions, please call your gastroenterologist to clarify.  If you requested that your care partner not be given the details of your procedure findings, then the procedure report has been included in a sealed envelope for you to review at your convenience later.  YOU SHOULD EXPECT: Some feelings of bloating in the abdomen. Passage of more gas than usual.  Walking can help get rid of the air that was put into your GI tract during the procedure and reduce the bloating. If you had a lower endoscopy (such as a colonoscopy or flexible sigmoidoscopy) you may notice spotting of blood in your stool or on the toilet paper. If you underwent a bowel prep for your procedure, you may not have a normal bowel movement for a few days.  Please Note:  You might notice some irritation and congestion in your nose or some drainage.  This is from the oxygen used during your procedure.  There is no need for concern and it should clear up in a day or so.  SYMPTOMS TO REPORT IMMEDIATELY:    Following upper endoscopy (EGD)  Vomiting of blood or coffee ground material  New chest pain or pain under the shoulder blades  Painful or persistently difficult swallowing  New shortness of breath  Fever of 100F or higher  Black, tarry-looking stools  For urgent or emergent issues, a gastroenterologist can be reached at any hour by calling [phone].   DIET:  We do recommend a small meal at first, but then you may proceed to your regular diet.  Drink plenty of fluids but you should avoid alcoholic beverages for 24 hours.  ACTIVITY:  You should plan to take it easy for the rest of today and you should NOT DRIVE or use heavy  machinery until tomorrow (because of the sedation medicines used during the test).    FOLLOW UP: Our staff will call the number listed on your records the next business day following your procedure to check on you and address any questions or concerns that you may have regarding the information given to you following your procedure. If we do not reach you, we will leave a message.  However, if you are feeling well and you are not experiencing any problems, there is no need to return our call.  We will assume that you have returned to your regular daily activities without incident.  If any biopsies were taken you will be contacted by phone or by letter within the next 1-3 weeks.  Please call us at [phone] if you have not heard about the biopsies in 3 weeks.    SIGNATURES/CONFIDENTIALITY: You and/or your care partner have signed paperwork which will be entered into your electronic medical record.  These signatures attest to the fact that that the information above on your After Visit Summary has been reviewed and is understood.  Full responsibility of the confidentiality of this discharge information lies with you and/or your care-partner.

## 2017-09-25 NOTE — Progress Notes (Signed)
Report given to PACU, vss 

## 2017-09-26 ENCOUNTER — Telehealth: Payer: Self-pay | Admitting: *Deleted

## 2017-09-26 NOTE — Telephone Encounter (Signed)
  Follow up Call-  Call back number 09/25/2017 07/17/2017  Post procedure Call Back phone  # [phone]  Permission to leave phone message Yes Yes  Some recent data might be hidden     Patient questions:  Do you have a fever, pain , or abdominal swelling? No. Pain Score  0 *  Have you tolerated food without any problems? Yes.    Have you been able to return to your normal activities? Yes.    Do you have any questions about your discharge instructions: Diet   No. Medications  No. Follow up visit  No.  Do you have questions or concerns about your Care? No.  Actions: * If pain score is 4 or above: No action needed, pain <4.

## 2017-10-29 DIAGNOSIS — G4733 Obstructive sleep apnea (adult) (pediatric): Secondary | ICD-10-CM | POA: Diagnosis not present

## 2017-11-11 DIAGNOSIS — E1165 Type 2 diabetes mellitus with hyperglycemia: Secondary | ICD-10-CM | POA: Diagnosis not present

## 2017-11-11 DIAGNOSIS — E291 Testicular hypofunction: Secondary | ICD-10-CM | POA: Diagnosis not present

## 2017-11-11 DIAGNOSIS — I10 Essential (primary) hypertension: Secondary | ICD-10-CM | POA: Diagnosis not present

## 2017-11-12 DIAGNOSIS — E291 Testicular hypofunction: Secondary | ICD-10-CM | POA: Diagnosis not present

## 2017-11-12 DIAGNOSIS — I10 Essential (primary) hypertension: Secondary | ICD-10-CM | POA: Diagnosis not present

## 2017-11-27 DIAGNOSIS — R350 Frequency of micturition: Secondary | ICD-10-CM | POA: Diagnosis not present

## 2017-11-27 DIAGNOSIS — E1165 Type 2 diabetes mellitus with hyperglycemia: Secondary | ICD-10-CM | POA: Diagnosis not present

## 2017-11-27 DIAGNOSIS — E119 Type 2 diabetes mellitus without complications: Secondary | ICD-10-CM | POA: Diagnosis not present

## 2017-11-27 DIAGNOSIS — E7800 Pure hypercholesterolemia, unspecified: Secondary | ICD-10-CM | POA: Diagnosis not present

## 2017-11-27 DIAGNOSIS — I10 Essential (primary) hypertension: Secondary | ICD-10-CM | POA: Diagnosis not present

## 2017-11-27 DIAGNOSIS — R05 Cough: Secondary | ICD-10-CM | POA: Diagnosis not present

## 2018-01-20 DIAGNOSIS — G4733 Obstructive sleep apnea (adult) (pediatric): Secondary | ICD-10-CM | POA: Diagnosis not present

## 2018-03-20 DIAGNOSIS — R350 Frequency of micturition: Secondary | ICD-10-CM | POA: Diagnosis not present

## 2018-03-20 DIAGNOSIS — E1165 Type 2 diabetes mellitus with hyperglycemia: Secondary | ICD-10-CM | POA: Diagnosis not present

## 2018-03-27 DIAGNOSIS — R413 Other amnesia: Secondary | ICD-10-CM | POA: Diagnosis not present

## 2018-03-27 DIAGNOSIS — I10 Essential (primary) hypertension: Secondary | ICD-10-CM | POA: Diagnosis not present

## 2018-03-27 DIAGNOSIS — E7800 Pure hypercholesterolemia, unspecified: Secondary | ICD-10-CM | POA: Diagnosis not present

## 2018-03-27 DIAGNOSIS — E119 Type 2 diabetes mellitus without complications: Secondary | ICD-10-CM | POA: Diagnosis not present

## 2018-03-28 IMAGING — CT CT HEAD W/O CM
3 series · 16 of 47 positions shown, 19 images · non-contrast
Comparison: None

CLINICAL DATA: Memory loss.

EXAM:
CT HEAD WITHOUT CONTRAST
TECHNIQUE: Contiguous axial images were obtained from the base of the skull
through the vertex without intravenous contrast.

[Series 2: head w/(date) · axial · 0.44mm/px · z∈[-64,+71]mm · 10 of 33 slices shown, 13 images]
[im 3/33  brain]
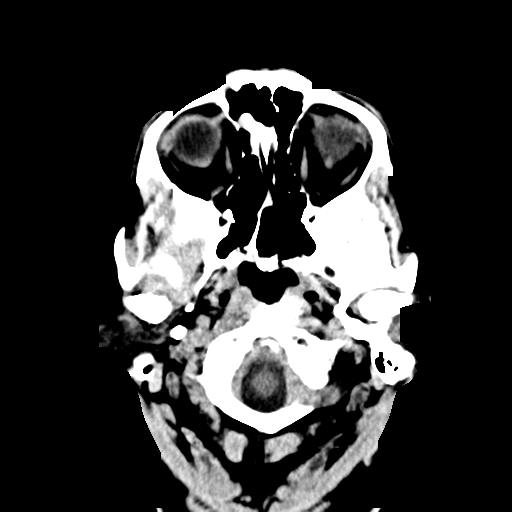
[im 3/33  bone]
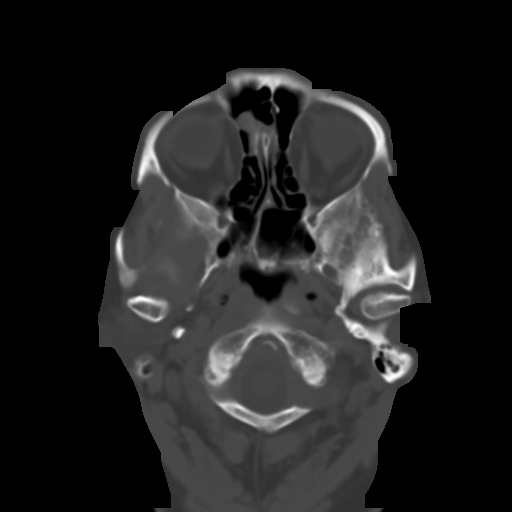
[im 6/33  brain]
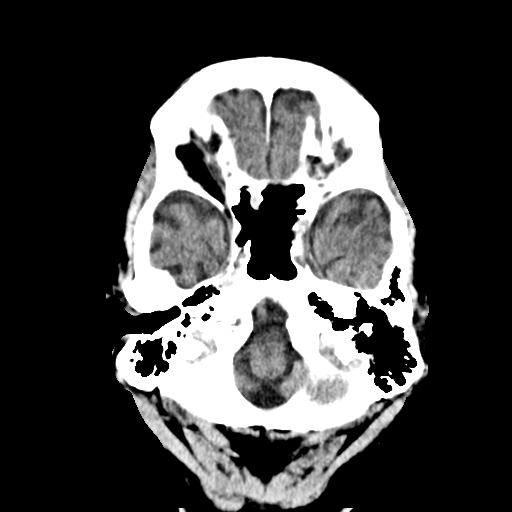
[im 9/33  brain]
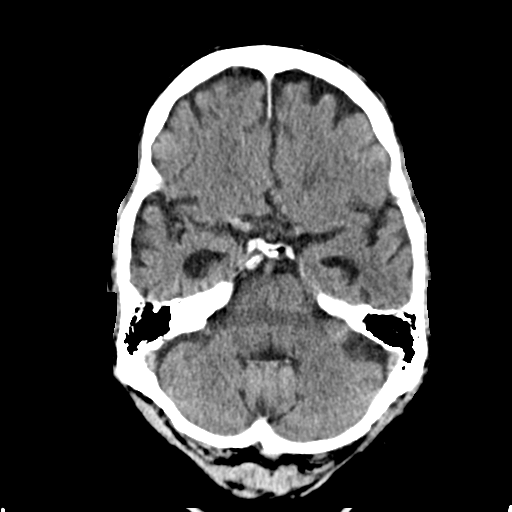
[im 12/33  brain]
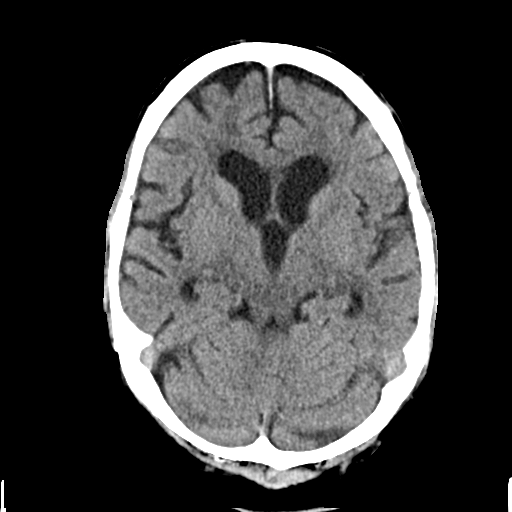
[im 15/33  brain]
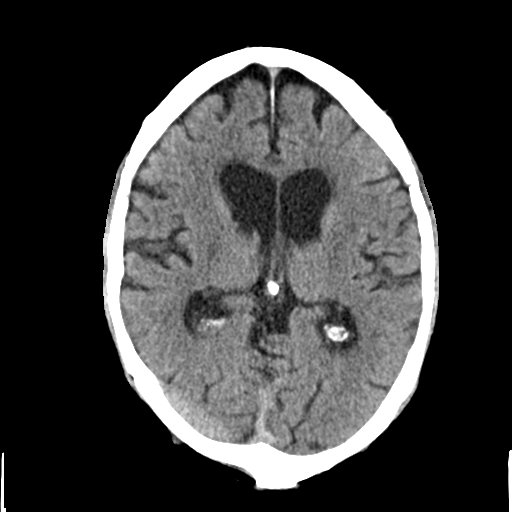
[im 15/33  bone]
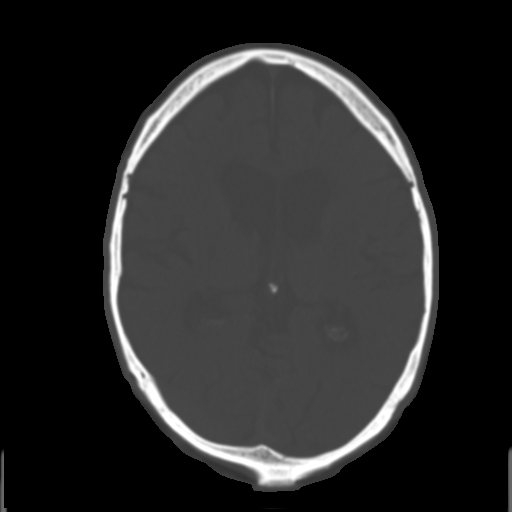
[im 18/33  brain]
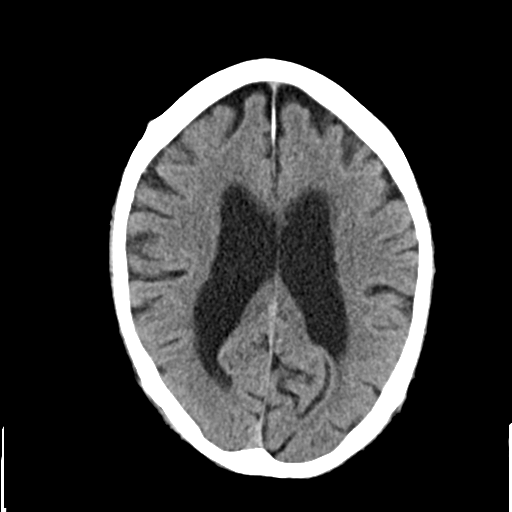
[im 21/33  brain]
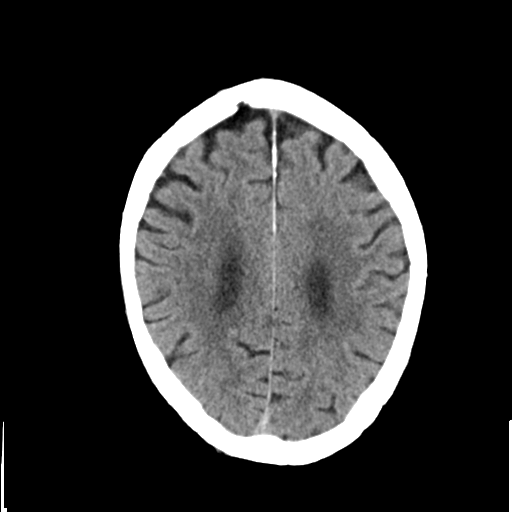
[im 25/33  brain]
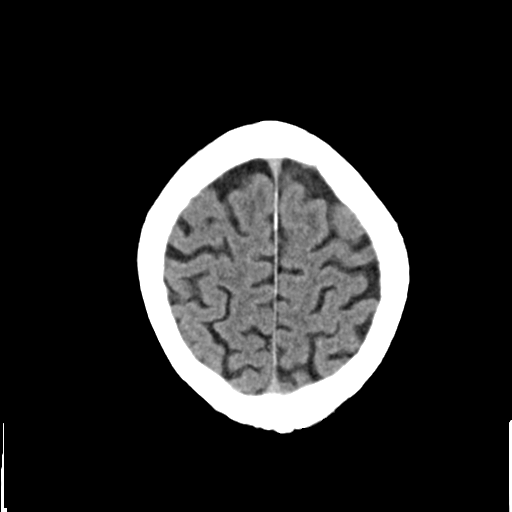
[im 27/33  brain]
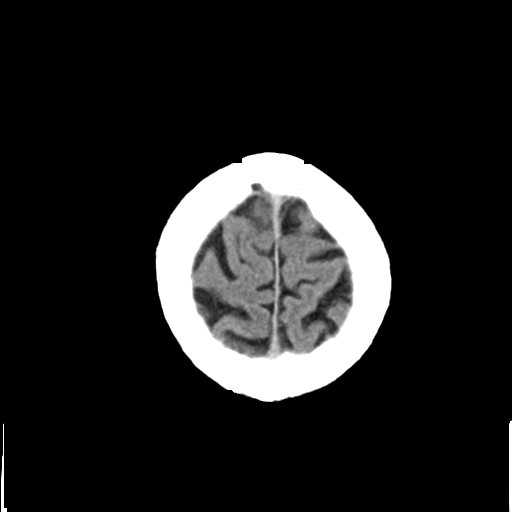
[im 27/33  bone]
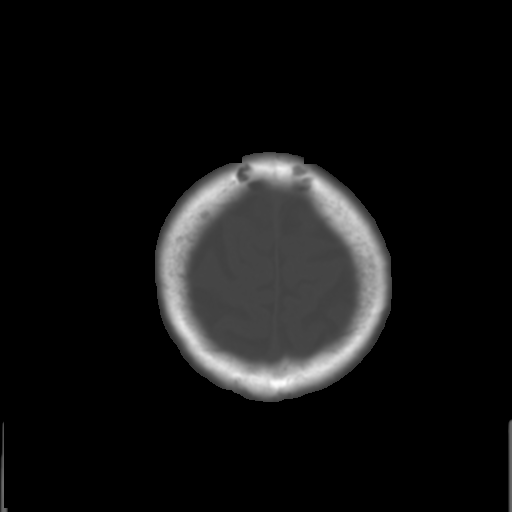
[im 30/33  brain]
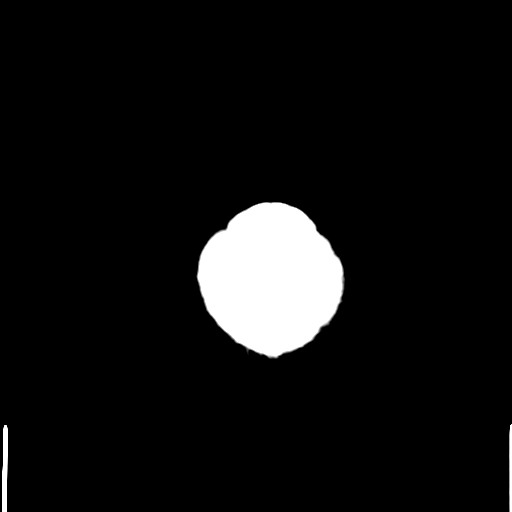

[Series 4: cor · coronal · 0.31mm/px · 3 of 68 slices shown]
[im 23/68  brain]
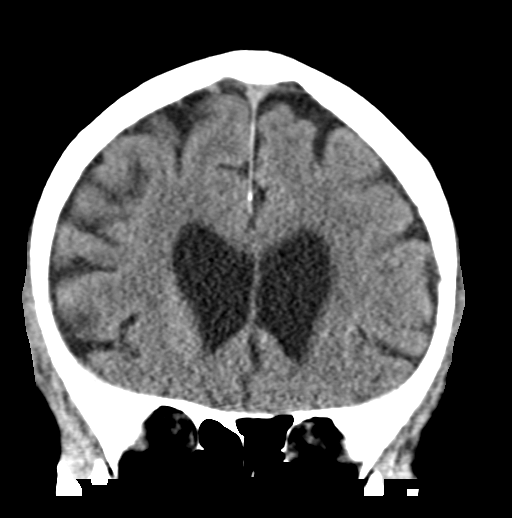
[im 30/68  brain]
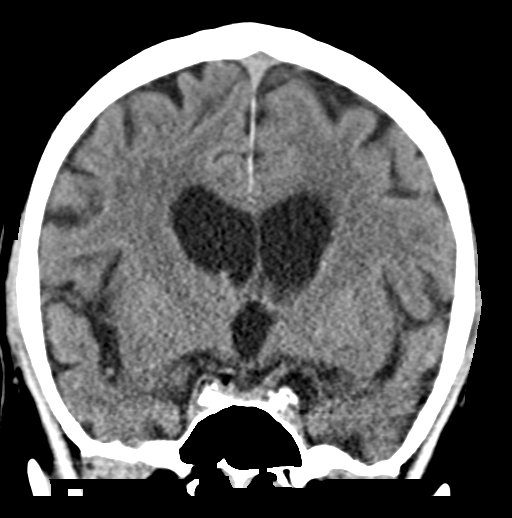
[im 38/68  brain]
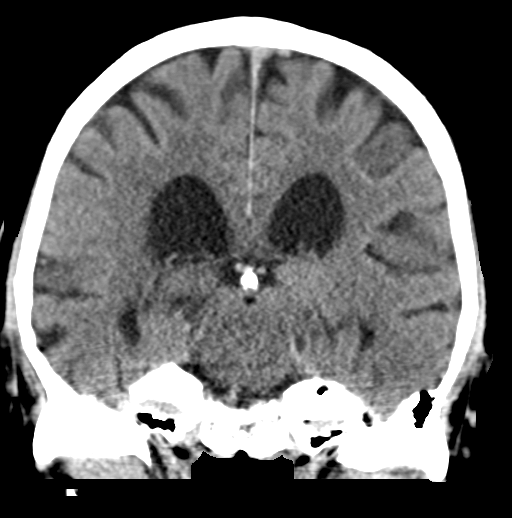

[Series 5: sag · sagittal · 0.33mm/px · 3 of 54 slices shown]
[im 18/54  brain]
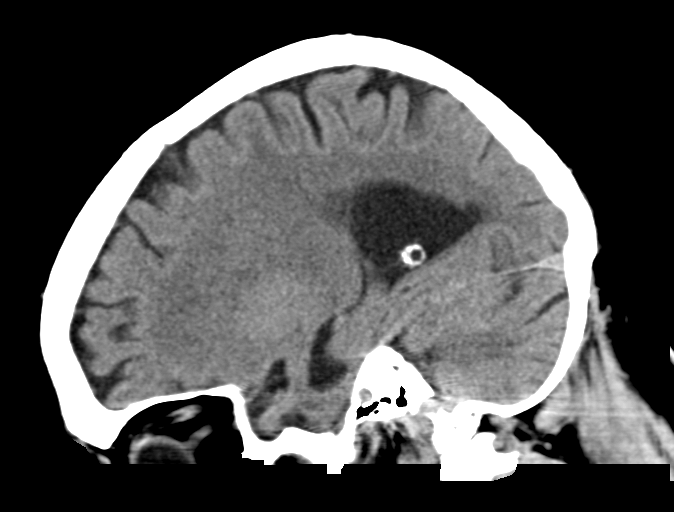
[im 27/54  brain]
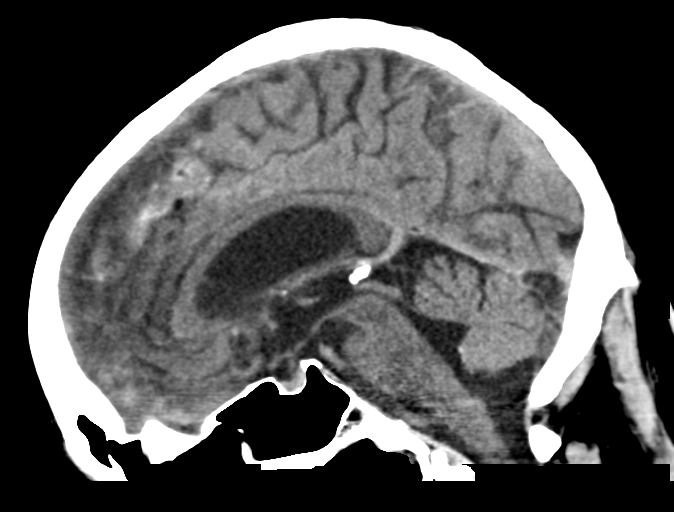
[im 36/54  brain]
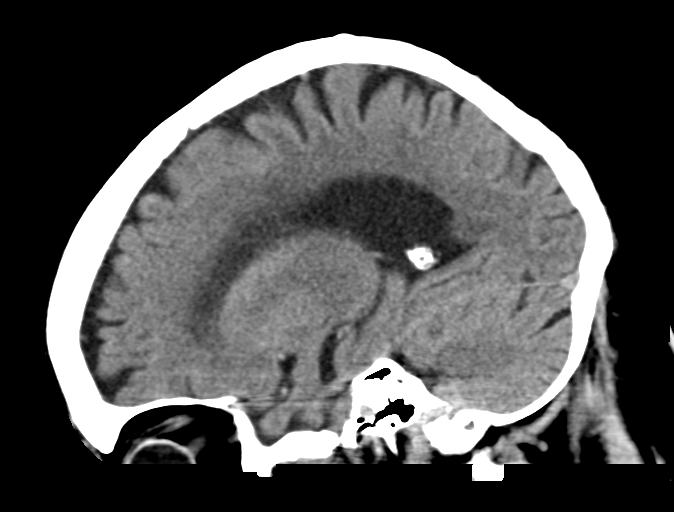

[16 of 47 positions shown; findings below may reference images not displayed]

FINDINGS: Brain: There is prominence of the sulci and ventricles compatible
with brain atrophy. Low attenuation within the periventricular and
subcortical white matter compatible with chronic microvascular
disease. There is no abnormal extra-axial fluid collection,
intracranial hemorrhage, mass or evidence of acute infarct.

Vascular: No hyperdense vessel or unexpected calcification.

Skull: Normal. Negative for fracture or focal lesion.

Sinuses/Orbits: No acute finding.

Other: None.
IMPRESSION: 1. No acute intracranial abnormality.
2. Chronic small vessel ischemic change and brain atrophy.

## 2018-04-28 DIAGNOSIS — R413 Other amnesia: Secondary | ICD-10-CM | POA: Diagnosis not present

## 2018-04-28 DIAGNOSIS — E119 Type 2 diabetes mellitus without complications: Secondary | ICD-10-CM | POA: Diagnosis not present

## 2018-04-28 DIAGNOSIS — E7800 Pure hypercholesterolemia, unspecified: Secondary | ICD-10-CM | POA: Diagnosis not present

## 2018-05-06 DIAGNOSIS — G4733 Obstructive sleep apnea (adult) (pediatric): Secondary | ICD-10-CM | POA: Diagnosis not present

## 2018-07-29 DIAGNOSIS — H35033 Hypertensive retinopathy, bilateral: Secondary | ICD-10-CM | POA: Diagnosis not present

## 2018-07-29 DIAGNOSIS — H01001 Unspecified blepharitis right upper eyelid: Secondary | ICD-10-CM | POA: Diagnosis not present

## 2018-07-29 DIAGNOSIS — E119 Type 2 diabetes mellitus without complications: Secondary | ICD-10-CM | POA: Diagnosis not present

## 2018-07-29 DIAGNOSIS — H01004 Unspecified blepharitis left upper eyelid: Secondary | ICD-10-CM | POA: Diagnosis not present

## 2018-07-29 DIAGNOSIS — H43812 Vitreous degeneration, left eye: Secondary | ICD-10-CM | POA: Diagnosis not present

## 2018-09-08 DIAGNOSIS — Z85828 Personal history of other malignant neoplasm of skin: Secondary | ICD-10-CM | POA: Diagnosis not present

## 2018-09-08 DIAGNOSIS — L821 Other seborrheic keratosis: Secondary | ICD-10-CM | POA: Diagnosis not present

## 2018-09-08 DIAGNOSIS — D1801 Hemangioma of skin and subcutaneous tissue: Secondary | ICD-10-CM | POA: Diagnosis not present

## 2018-09-29 DIAGNOSIS — E7800 Pure hypercholesterolemia, unspecified: Secondary | ICD-10-CM | POA: Diagnosis not present

## 2018-09-29 DIAGNOSIS — E119 Type 2 diabetes mellitus without complications: Secondary | ICD-10-CM | POA: Diagnosis not present

## 2018-10-06 DIAGNOSIS — R413 Other amnesia: Secondary | ICD-10-CM | POA: Diagnosis not present

## 2018-10-06 DIAGNOSIS — E119 Type 2 diabetes mellitus without complications: Secondary | ICD-10-CM | POA: Diagnosis not present

## 2018-10-06 DIAGNOSIS — I10 Essential (primary) hypertension: Secondary | ICD-10-CM | POA: Diagnosis not present

## 2018-10-06 DIAGNOSIS — J309 Allergic rhinitis, unspecified: Secondary | ICD-10-CM | POA: Diagnosis not present

## 2019-02-25 DIAGNOSIS — G4733 Obstructive sleep apnea (adult) (pediatric): Secondary | ICD-10-CM | POA: Diagnosis not present

## 2019-04-01 DIAGNOSIS — E119 Type 2 diabetes mellitus without complications: Secondary | ICD-10-CM | POA: Diagnosis not present

## 2019-04-01 DIAGNOSIS — I10 Essential (primary) hypertension: Secondary | ICD-10-CM | POA: Diagnosis not present

## 2019-04-03 ENCOUNTER — Other Ambulatory Visit: Payer: Self-pay

## 2019-04-03 NOTE — Patient Outreach (Signed)
[facility]) Care Management  04/03/2019  AVONTE DIELEMAN March 07, 1934 [id number]   Medication Adherence call to Mr. Mourad Lamanna Hippa Identifiers Verify spoke with patient wife patient is past due on Losartan 25 mg,wife explain patient takes 1 tablet daily and takes on a regular basis,patient has enough for other month and will order from [employer]. Mr. Conto is showing past due under [place].   [place] Management Direct Dial [phone]  Fax [phone] [email or web address]

## 2019-04-08 DIAGNOSIS — R413 Other amnesia: Secondary | ICD-10-CM | POA: Diagnosis not present

## 2019-04-08 DIAGNOSIS — E119 Type 2 diabetes mellitus without complications: Secondary | ICD-10-CM | POA: Diagnosis not present

## 2019-04-08 DIAGNOSIS — I10 Essential (primary) hypertension: Secondary | ICD-10-CM | POA: Diagnosis not present

## 2019-05-13 DIAGNOSIS — D225 Melanocytic nevi of trunk: Secondary | ICD-10-CM | POA: Diagnosis not present

## 2019-05-13 DIAGNOSIS — D2261 Melanocytic nevi of right upper limb, including shoulder: Secondary | ICD-10-CM | POA: Diagnosis not present

## 2019-05-13 DIAGNOSIS — Z85828 Personal history of other malignant neoplasm of skin: Secondary | ICD-10-CM | POA: Diagnosis not present

## 2019-05-13 DIAGNOSIS — L814 Other melanin hyperpigmentation: Secondary | ICD-10-CM | POA: Diagnosis not present

## 2019-05-13 DIAGNOSIS — L821 Other seborrheic keratosis: Secondary | ICD-10-CM | POA: Diagnosis not present

## 2019-06-17 ENCOUNTER — Ambulatory Visit: Payer: Medicare Other | Attending: Internal Medicine

## 2019-06-17 DIAGNOSIS — Z23 Encounter for immunization: Secondary | ICD-10-CM | POA: Insufficient documentation

## 2019-06-17 NOTE — Progress Notes (Signed)
   Covid-19 Vaccination Clinic  Name:  Gregory Lamb    MRN: [id number] DOB: December 21, 1933  06/17/2019  [name] was observed post Covid-19 immunization for 15 minutes without incidence. He was provided with Vaccine Information Sheet and instruction to access the V-Safe system.   [name] was instructed to call 911 with any severe reactions post vaccine: Marland Kitchen Difficulty breathing  . Swelling of your face and throat  . A fast heartbeat  . A bad rash all over your body  . Dizziness and weakness    Immunizations Administered    Name Date Dose VIS Date Route   Pfizer COVID-19 Vaccine 06/17/2019 11:04 AM 0.3 mL 05/15/2019 Intramuscular   Manufacturer: [place]   Lot: [id number]   [place]: [id number]

## 2019-07-07 ENCOUNTER — Ambulatory Visit: Payer: Medicare Other | Attending: Internal Medicine

## 2019-07-07 DIAGNOSIS — Z23 Encounter for immunization: Secondary | ICD-10-CM | POA: Insufficient documentation

## 2019-07-07 NOTE — Progress Notes (Signed)
   Covid-19 Vaccination Clinic  Name:  Gregory Lamb    MRN: [id number] DOB: [birth year]  07/07/2019  Gregory Lamb was observed post Covid-19 immunization for 15 minutes without incidence. He was provided with Vaccine Information Sheet and instruction to access the V-Safe system.   Gregory Lamb was instructed to call 911 with any severe reactions post vaccine: Marland Kitchen Difficulty breathing  . Swelling of your face and throat  . A fast heartbeat  . A bad rash all over your body  . Dizziness and weakness    Immunizations Administered    Name Date Dose VIS Date Route   Pfizer COVID-19 Vaccine 07/07/2019 10:26 AM 0.3 mL 05/15/2019 Intramuscular   Manufacturer: [place]   Lot: [id number]   [place]: [id number]

## 2019-08-07 DIAGNOSIS — H353111 Nonexudative age-related macular degeneration, right eye, early dry stage: Secondary | ICD-10-CM | POA: Diagnosis not present

## 2019-08-07 DIAGNOSIS — H35033 Hypertensive retinopathy, bilateral: Secondary | ICD-10-CM | POA: Diagnosis not present

## 2019-08-07 DIAGNOSIS — E119 Type 2 diabetes mellitus without complications: Secondary | ICD-10-CM | POA: Diagnosis not present

## 2019-08-07 DIAGNOSIS — H0100A Unspecified blepharitis right eye, upper and lower eyelids: Secondary | ICD-10-CM | POA: Diagnosis not present

## 2019-08-20 ENCOUNTER — Ambulatory Visit: Payer: Medicare Other | Admitting: Neurology

## 2019-08-20 ENCOUNTER — Telehealth: Payer: Self-pay | Admitting: Neurology

## 2019-08-20 NOTE — Telephone Encounter (Signed)
This patient did not show for a new patient appointment today. 

## 2019-08-25 ENCOUNTER — Encounter: Payer: Self-pay | Admitting: Neurology

## 2019-09-10 DIAGNOSIS — I10 Essential (primary) hypertension: Secondary | ICD-10-CM | POA: Diagnosis not present

## 2019-09-10 DIAGNOSIS — E119 Type 2 diabetes mellitus without complications: Secondary | ICD-10-CM | POA: Diagnosis not present

## 2019-09-10 DIAGNOSIS — R262 Difficulty in walking, not elsewhere classified: Secondary | ICD-10-CM | POA: Diagnosis not present

## 2019-10-12 DIAGNOSIS — R69 Illness, unspecified: Secondary | ICD-10-CM | POA: Diagnosis not present

## 2019-10-27 ENCOUNTER — Encounter: Payer: Self-pay | Admitting: Neurology

## 2019-10-27 ENCOUNTER — Other Ambulatory Visit: Payer: Self-pay

## 2019-10-27 ENCOUNTER — Ambulatory Visit: Payer: Medicare HMO | Admitting: Neurology

## 2019-10-27 VITALS — BP 135/81 | HR 75 | Ht 68.0 in | Wt 161.0 lb

## 2019-10-27 DIAGNOSIS — E538 Deficiency of other specified B group vitamins: Secondary | ICD-10-CM

## 2019-10-27 DIAGNOSIS — R269 Unspecified abnormalities of gait and mobility: Secondary | ICD-10-CM | POA: Diagnosis not present

## 2019-10-27 DIAGNOSIS — R5382 Chronic fatigue, unspecified: Secondary | ICD-10-CM | POA: Diagnosis not present

## 2019-10-27 DIAGNOSIS — R413 Other amnesia: Secondary | ICD-10-CM | POA: Diagnosis not present

## 2019-10-27 HISTORY — DX: Other amnesia: R41.3

## 2019-10-27 HISTORY — DX: Unspecified abnormalities of gait and mobility: R26.9

## 2019-10-27 MED ORDER — DONEPEZIL HCL 10 MG PO TABS
10.0000 mg | ORAL_TABLET | Freq: Every day | ORAL | 3 refills | Status: AC
Start: 2019-10-27 — End: ?

## 2019-10-27 NOTE — Progress Notes (Signed)
Reason for visit: Memory disorder, gait disorder  Referring physician: Dr. Alvera Lamb is a 86 y.o. male  History of present illness:  Gregory Lamb is an 86 year old right-handed white male with a history of troubles with short-term memory dating back 3 years or more.  The patient has been placed on low doses of Namenda and Aricept, he seems to tolerate this fairly well.  Occasionally he may have vivid dreams on the Aricept.  The patient reports that he does have problems with remembering names for people and things, he has short-term memory issues, he still operates a motor vehicle only occasionally and he has not had any safety issues or difficulty with directions.  He notes that his wife has always paid the bills, he does not do the finances.  His wife keeps up with the appointments and medications.  Over the last 2 years, he has had some alteration in his walking.  He has had more of a shuffling type walk and slightly stooped posture.  The patient not had any falls.  He reports no numbness or tingling in the feet or hands.  He denies any neck pain or low back pain or pain down the arms or legs.  He reports that his bowels and bladder are working well, he has not had any change in speech or handwriting.  There have been no tremors.  There are concerns for parkinsonism, he was referred to this office for further evaluation.  He does have diabetes, his last hemoglobin A1c was 6.4.   Past Medical History:  Diagnosis Date  . BPH (benign prostatic hypertrophy)   . Cataract    bil catarCTS REMOVED  . Colon polyps   . Diabetes mellitus   . Hernia    abdominal/ventral  . Memory loss   . Sleep apnea    last study 5 years ago- per patient SEVERE.  wears c_pap    Past Surgical History:  Procedure Laterality Date  . COLONOSCOPY    . EYE SURGERY     cataract extraction with IOL  . hard of hearing     wear bil haring aids  . LIPOMA EXCISION     back  . POLYPECTOMY    .  TONSILLECTOMY    . TRANSURETHRAL RESECTION OF PROSTATE  12/12/2011   Procedure: TRANSURETHRAL RESECTION OF THE PROSTATE WITH GYRUS INSTRUMENTS;  Surgeon: Franchot Gallo, MD;  Location: [facility];  Service: Urology;  Laterality: N/A;  . UPPER GASTROINTESTINAL ENDOSCOPY      Family History  Problem Relation Age of Onset  . Colon cancer Neg Hx   . Esophageal cancer Neg Hx   . Liver cancer Neg Hx   . Pancreatic cancer Neg Hx   . Prostate cancer Neg Hx   . Rectal cancer Neg Hx   . Stomach cancer Neg Hx     Social history:  reports that he quit smoking about 39 years ago. His smoking use included cigarettes. He has a 50.00 pack-year smoking history. He has quit using smokeless tobacco. He reports current alcohol use of about 7.0 standard drinks of alcohol per week. He reports that he does not use drugs.  Medications:  Prior to Admission medications   Medication Sig Start Date End Date Taking? Authorizing Provider  aspirin EC 81 MG tablet Take 81 mg by mouth daily.   Yes [provider]  azelastine (ASTELIN) 0.1 % nasal spray Place 1 spray into both nostrils 2 (two) times daily. Use  in each nostril as directed   Yes [provider]  diphenhydrAMINE (BENADRYL) 25 MG tablet Take 25 mg by mouth daily as needed. Allergies   Yes [provider]  donepezil (ARICEPT) 5 MG tablet Take 5 mg by mouth daily. 07/31/19  Yes [provider]  losartan (COZAAR) 25 MG tablet Take 25 mg by mouth daily. Takes 1/2 tablet daily   Yes [provider]  Melatonin 3 MG CAPS Take 3 mg by mouth as needed.    Yes [provider]  memantine (NAMENDA) 5 MG tablet Take 5 mg by mouth 2 (two) times daily. 08/18/19  Yes [provider]  metFORMIN (GLUCOPHAGE) 500 MG tablet Take 500 mg by mouth 2 (two) times daily with a meal.   Yes [provider]  Omega-3 Fatty Acids (FISH OIL) 1200 MG CAPS Take 1,200 mg by mouth daily.   Yes [provider]    Tamsulosin HCl (FLOMAX) 0.4 MG CAPS Take 1 capsule (0.4 mg total) by mouth daily after supper. 12/13/11  Yes Dahlstedt, Annie Main, MD     No Known Allergies  ROS:  Out of a complete 14 system review of symptoms, the patient complains only of the following symptoms, and all other reviewed systems are negative.  Memory problems, walking difficulty  Blood pressure 135/81, pulse 75, height 5\' 8"  (1.727 m), weight 161 lb (73 kg).  Physical Exam  General: The patient is alert and cooperative at the time of the examination.  Eyes: Pupils are equal, round, and reactive to light. Discs are flat bilaterally.  Neck: The neck is supple, no carotid bruits are noted.  Respiratory: The respiratory examination is clear.  Cardiovascular: The cardiovascular examination reveals a regular rate and rhythm, no obvious murmurs or rubs are noted.  Skin: Extremities are without significant edema.  Neurologic Exam  Mental status: The patient is alert and oriented x 3 at the time of the examination. The Mini-Mental status examination done today shows a total score 20/30.  The patient can name 7 four-legged animals in 60 seconds.  Cranial nerves: Facial symmetry is present. There is good sensation of the face to pinprick and soft touch bilaterally. The strength of the facial muscles and the muscles to head turning and shoulder shrug are normal bilaterally. Speech is well enunciated, no aphasia or dysarthria is noted. Extraocular movements are full. Visual fields are full. The tongue is midline, and the patient has symmetric elevation of the soft palate. No obvious hearing deficits are noted.  Motor: The motor testing reveals 5 over 5 strength of all 4 extremities. Good symmetric motor tone is noted throughout.  Sensory: Sensory testing is intact to pinprick, soft touch, vibration sensation, and position sense on all 4 extremities, with exception that there is some stocking pattern pinprick sensory deficit up  to the knees bilaterally. No evidence of extinction is noted.  Coordination: Cerebellar testing reveals good finger-nose-finger and heel-to-shin bilaterally.  Gait and station: The patient is able to arise from a seated position with arms crossed.  Once up, he is able to ambulate independently but he does have short shuffling steps, slightly stooped posture.  He has symmetric arm swing.  Tandem gait is slightly unsteady.  Romberg is negative.  Reflexes: Deep tendon reflexes are symmetric, but somewhat depressed bilaterally.  The knee jerk reflexes are well-maintained, ankle jerk reflexes are depressed.  Toes are downgoing bilaterally.   CT head 10/15/16:  IMPRESSION: 1. No acute intracranial abnormality. 2. Chronic small vessel ischemic  change and brain atrophy  * CT scan images were reviewed online. I agree with the written report.    Assessment/Plan:  1.  Gait disturbance  2.  Memory disturbance, dementia  The patient certainly does have a shuffling type gait, but he does not have full features of parkinsonism as he has good armswing.  The walking issue will need to be monitored over time, apparently this has been an issue for 2 years without development of a full-blown Parkinson syndrome.  The patient will undergo MRI of the brain.  He has no definite evidence of a significant peripheral neuropathy that would affect the balance.  The patient is on low doses of Aricept and Namenda, I will increase the Aricept to 10 mg daily.  He will follow-up in 6 months.  Blood work will be done today.  Jill Alexanders MD 10/27/2019 8:31 AM  [facility] [address] [place], [place] [address]  Phone [phone] Fax [phone]

## 2019-10-28 ENCOUNTER — Telehealth: Payer: Self-pay | Admitting: Neurology

## 2019-10-28 DIAGNOSIS — R69 Illness, unspecified: Secondary | ICD-10-CM | POA: Diagnosis not present

## 2019-10-28 NOTE — Telephone Encounter (Signed)
[employer] medicare order sent to [facility]. They will obtain the auth and reach out to the patient to schedule.  °

## 2019-10-29 LAB — SEDIMENTATION RATE: Sed Rate: 2 mm/hr (ref 0–30)

## 2019-10-29 LAB — RPR: RPR Ser Ql: NONREACTIVE

## 2019-10-29 LAB — TSH: TSH: 1.67 u[IU]/mL (ref 0.450–4.500)

## 2019-10-29 LAB — COPPER, SERUM: Copper: 111 ug/dL (ref 69–132)

## 2019-10-29 LAB — VITAMIN B12: Vitamin B-12: 1267 pg/mL — ABNORMAL HIGH (ref 232–1245)

## 2019-11-04 ENCOUNTER — Telehealth: Payer: Self-pay

## 2019-11-04 NOTE — Telephone Encounter (Signed)
Pt wife called back in regards to missed call  

## 2019-11-04 NOTE — Telephone Encounter (Signed)
-----   Message from Charles K Willis, MD sent at 10/29/2019 10:56 AM EDT -----  The blood work results are unremarkable. Please call the patient. ----- Message ----- From: Interface, [facility] Lab Results In Sent: 10/28/2019   7:38 AM EDT To: Charles K Willis, MD   

## 2019-11-04 NOTE — Telephone Encounter (Signed)
Left vm for pts wife to call back about lab work results.Pt has some memory issues. 

## 2019-11-04 NOTE — Telephone Encounter (Signed)
Return wife call and left vm to return phone call.

## 2019-11-05 NOTE — Telephone Encounter (Signed)
Gregory RammingPU:[id number] (exp. 11/05/19 to 05/03/20) patient is scheduled at [facility] for 11/06/19.

## 2019-11-06 ENCOUNTER — Other Ambulatory Visit: Payer: Self-pay

## 2019-11-06 ENCOUNTER — Ambulatory Visit
Admission: RE | Admit: 2019-11-06 | Discharge: 2019-11-06 | Disposition: A | Discharge status: Home / Self Care | Payer: 59 | Source: Ambulatory Visit | Attending: Neurology | Admitting: Neurology

## 2019-11-06 DIAGNOSIS — R413 Other amnesia: Secondary | ICD-10-CM

## 2019-11-09 ENCOUNTER — Telehealth: Payer: Self-pay

## 2019-11-09 ENCOUNTER — Telehealth: Payer: Self-pay | Admitting: Neurology

## 2019-11-09 NOTE — Telephone Encounter (Signed)
-----   Message from Kathrynn Ducking, MD sent at 10/29/2019 10:56 AM EDT -----  The blood work results are unremarkable. Please call the patient. ----- Message ----- From: Lavone Neri Lab Results In Sent: 10/28/2019   7:38 AM EDT To: Kathrynn Ducking, MD

## 2019-11-09 NOTE — Telephone Encounter (Signed)
  I called the patient.  MRI of the brain does show atrophy and some mild white matter changes.  Patient is on Aricept, he will continue the medication.   MRI brain 11/09/19:  IMPRESSION:   MRI brain (without) demonstrating: -Mild perisylvian and moderate mesial temporal atrophy. -Mild chronic small vessel ischemic disease. -No acute findings.

## 2019-11-09 NOTE — Telephone Encounter (Signed)
Left vm for pts wife to call back about lab work results.Pt has some memory issues.

## 2019-11-30 DIAGNOSIS — Z7984 Long term (current) use of oral hypoglycemic drugs: Secondary | ICD-10-CM | POA: Diagnosis not present

## 2019-11-30 DIAGNOSIS — N400 Enlarged prostate without lower urinary tract symptoms: Secondary | ICD-10-CM | POA: Diagnosis not present

## 2019-11-30 DIAGNOSIS — Z85828 Personal history of other malignant neoplasm of skin: Secondary | ICD-10-CM | POA: Diagnosis not present

## 2019-11-30 DIAGNOSIS — Z87891 Personal history of nicotine dependence: Secondary | ICD-10-CM | POA: Diagnosis not present

## 2019-11-30 DIAGNOSIS — R69 Illness, unspecified: Secondary | ICD-10-CM | POA: Diagnosis not present

## 2019-11-30 DIAGNOSIS — E119 Type 2 diabetes mellitus without complications: Secondary | ICD-10-CM | POA: Diagnosis not present

## 2019-11-30 DIAGNOSIS — I10 Essential (primary) hypertension: Secondary | ICD-10-CM | POA: Diagnosis not present

## 2019-11-30 DIAGNOSIS — Z803 Family history of malignant neoplasm of breast: Secondary | ICD-10-CM | POA: Diagnosis not present

## 2020-02-16 ENCOUNTER — Ambulatory Visit: Payer: Self-pay | Attending: Internal Medicine

## 2020-02-16 DIAGNOSIS — Z23 Encounter for immunization: Secondary | ICD-10-CM

## 2020-02-16 NOTE — Progress Notes (Signed)
° °  Covid-19 Vaccination Clinic  Name:  Gregory Lamb    MRN: [id number] DOB: 01/10/1934  02/16/2020  Mr. Gregory Lamb was observed post Covid-19 immunization for 15 minutes without incident. He was provided with Vaccine Information Sheet and instruction to access the V-Safe system. Vaccinated By: Lelon Huh.  Mr. Gregory Lamb was instructed to call 911 with any severe reactions post vaccine:  Difficulty breathing   Swelling of face and throat   A fast heartbeat   A bad rash all over body   Dizziness and weakness

## 2020-05-02 ENCOUNTER — Encounter: Payer: Self-pay | Admitting: Neurology

## 2020-05-02 ENCOUNTER — Ambulatory Visit: Payer: Medicare HMO | Admitting: Neurology

## 2020-05-02 NOTE — Progress Notes (Deleted)
PATIENT: Gregory Lamb DOB: [birth year]  REASON FOR VISIT: follow up HISTORY FROM: patient  HISTORY OF PRESENT ILLNESS: Today 05/02/20 Gregory Lamb is an 86 year old male with history of short-term memory loss and gait disturbance.  Has a shuffling type gait consistent with parkinsonism, but no full features.  MRI of the brain showed atrophy and some mild white matter changes.  Laboratory evaluation (B12, RPR, sed rate, TSH, copper) were unremarkable. HISTORY 10/27/2019 Dr. Jannifer Franklin: Gregory Lamb is an 86 year old right-handed white male with a history of troubles with short-term memory dating back 3 years or more.  The patient has been placed on low doses of Namenda and Aricept, he seems to tolerate this fairly well.  Occasionally he may have vivid dreams on the Aricept.  The patient reports that he does have problems with remembering names for people and things, he has short-term memory issues, he still operates a motor vehicle only occasionally and he has not had any safety issues or difficulty with directions.  He notes that his wife has always paid the bills, he does not do the finances.  His wife keeps up with the appointments and medications.  Over the last 2 years, he has had some alteration in his walking.  He has had more of a shuffling type walk and slightly stooped posture.  The patient not had any falls.  He reports no numbness or tingling in the feet or hands.  He denies any neck pain or low back pain or pain down the arms or legs.  He reports that his bowels and bladder are working well, he has not had any change in speech or handwriting.  There have been no tremors.  There are concerns for parkinsonism, he was referred to this office for further evaluation.  He does have diabetes, his last hemoglobin A1c was 6.4.     REVIEW OF SYSTEMS: Out of a complete 14 system review of symptoms, the patient complains only of the following symptoms, and all other reviewed systems are  negative.  ALLERGIES: No Known Allergies  HOME MEDICATIONS: Outpatient Medications Prior to Visit  Medication Sig Dispense Refill  . aspirin EC 81 MG tablet Take 81 mg by mouth daily.    Marland Kitchen azelastine (ASTELIN) 0.1 % nasal spray Place 1 spray into both nostrils 2 (two) times daily. Use in each nostril as directed    . CALCIUM-VITAMIN D PO Take by mouth.    . diphenhydrAMINE (BENADRYL) 25 MG tablet Take 25 mg by mouth daily as needed. Allergies    . donepezil (ARICEPT) 10 MG tablet Take 1 tablet (10 mg total) by mouth daily. 90 tablet 3  . losartan (COZAAR) 25 MG tablet Take 25 mg by mouth daily. Takes 1/2 tablet daily    . Melatonin 3 MG CAPS Take 3 mg by mouth as needed.     . memantine (NAMENDA) 5 MG tablet Take 5 mg by mouth 2 (two) times daily.    . metFORMIN (GLUCOPHAGE) 500 MG tablet Take 500 mg by mouth 2 (two) times daily with a meal.    . Omega-3 Fatty Acids (FISH OIL) 1200 MG CAPS Take 1,200 mg by mouth daily.    . Tamsulosin HCl (FLOMAX) 0.4 MG CAPS Take 1 capsule (0.4 mg total) by mouth daily after supper. 30 capsule    No facility-administered medications prior to visit.    PAST MEDICAL HISTORY: Past Medical History:  Diagnosis Date  . BPH (benign prostatic hypertrophy)   . Cataract  bil catarCTS REMOVED  . Colon polyps   . Diabetes mellitus   . Gait abnormality 10/27/2019  . Hernia    abdominal/ventral  . Memory disorder 10/27/2019  . Memory loss   . Sleep apnea    last study 5 years ago- per patient SEVERE.  wears c_pap    PAST SURGICAL HISTORY: Past Surgical History:  Procedure Laterality Date  . COLONOSCOPY    . EYE SURGERY     cataract extraction with IOL  . hard of hearing     wear bil haring aids  . LIPOMA EXCISION     back  . POLYPECTOMY    . TONSILLECTOMY    . TRANSURETHRAL RESECTION OF PROSTATE  12/12/2011   Procedure: TRANSURETHRAL RESECTION OF THE PROSTATE WITH GYRUS INSTRUMENTS;  Surgeon: Franchot Gallo, MD;  Location: [facility];  Service:  Urology;  Laterality: N/A;  . UPPER GASTROINTESTINAL ENDOSCOPY      FAMILY HISTORY: Family History  Problem Relation Age of Onset  . Colon cancer Neg Hx   . Esophageal cancer Neg Hx   . Liver cancer Neg Hx   . Pancreatic cancer Neg Hx   . Prostate cancer Neg Hx   . Rectal cancer Neg Hx   . Stomach cancer Neg Hx     SOCIAL HISTORY: Social History   Socioeconomic History  . Marital status: Married    Spouse name: Not on file  . Number of children: 4  . Years of education: Not on file  . Highest education level: Not on file  Occupational History  . Not on file  Tobacco Use  . Smoking status: Former Smoker    Packs/day: 1.00    Years: 50.00    Pack years: 50.00    Types: Cigarettes    Quit date: 12/10/1979    Years since quitting: 40.4  . Smokeless tobacco: Former Systems developer  . Tobacco comment: quit in 1980's or 1990's  Vaping Use  . Vaping Use: Never used  Substance and Sexual Activity  . Alcohol use: Yes    Alcohol/week: 7.0 standard drinks    Types: 7 Cans of beer per week    Comment: daily glass wine or beer  . Drug use: No  . Sexual activity: Not on file  Other Topics Concern  . Not on file  Social History Narrative  . Not on file   Social Determinants of Health   Financial Resource Strain:   . Difficulty of Paying Living Expenses: Not on file  Food Insecurity:   . Worried About Charity fundraiser in the Last Year: Not on file  . Ran Out of Food in the Last Year: Not on file  Transportation Needs:   . Lack of Transportation (Medical): Not on file  . Lack of Transportation (Non-Medical): Not on file  Physical Activity:   . Days of Exercise per Week: Not on file  . Minutes of Exercise per Session: Not on file  Stress:   . Feeling of Stress : Not on file  Social Connections:   . Frequency of Communication with Friends and Family: Not on file  . Frequency of Social Gatherings with Friends and Family: Not on file  . Attends Religious Services: Not on file  .  Active Member of Clubs or Organizations: Not on file  . Attends Archivist Meetings: Not on file  . Marital Status: Not on file  Intimate Partner Violence:   . Fear of Current or Ex-Partner: Not on file  .  Emotionally Abused: Not on file  . Physically Abused: Not on file  . Sexually Abused: Not on file      PHYSICAL EXAM  There were no vitals filed for this visit. There is no height or weight on file to calculate BMI.  Generalized: Well developed, in no acute distress   Neurological examination  Mentation: Alert oriented to time, place, history taking. Follows all commands speech and language fluent Cranial nerve II-XII: Pupils were equal round reactive to light. Extraocular movements were full, visual field were full on confrontational test. Facial sensation and strength were normal. Uvula tongue midline. Head turning and shoulder shrug  were normal and symmetric. Motor: The motor testing reveals 5 over 5 strength of all 4 extremities. Good symmetric motor tone is noted throughout.  Sensory: Sensory testing is intact to soft touch on all 4 extremities. No evidence of extinction is noted.  Coordination: Cerebellar testing reveals good finger-nose-finger and heel-to-shin bilaterally.  Gait and station: Gait is normal. Tandem gait is normal. Romberg is negative. No drift is seen.  Reflexes: Deep tendon reflexes are symmetric and normal bilaterally.   DIAGNOSTIC DATA (LABS, IMAGING, TESTING) - I reviewed patient records, labs, notes, testing and imaging myself where available.  No results found for: WBC, HGB, HCT, MCV, PLT No results found for: NA, K, CL, CO2, GLUCOSE, BUN, CREATININE, CALCIUM, PROT, ALBUMIN, AST, ALT, ALKPHOS, BILITOT, GFRNONAA, GFRAA No results found for: CHOL, HDL, LDLCALC, LDLDIRECT, TRIG, CHOLHDL No results found for: HGBA1C Lab Results  Component Value Date   VITAMINB12 1,267 (H) 10/27/2019   Lab Results  Component Value Date   TSH 1.670  10/27/2019      ASSESSMENT AND PLAN 86 y.o. year old male  has a past medical history of BPH (benign prostatic hypertrophy), Cataract, Colon polyps, Diabetes mellitus, Gait abnormality (10/27/2019), Hernia, Memory disorder (10/27/2019), Memory loss, and Sleep apnea. here with ***   I spent 15 minutes with the patient. 50% of this time was spent   Butler Denmark, Kelly, DNP 05/02/2020, 5:49 AM [facility] [address] [place], [place] [address] [phone]

## 2020-07-01 DIAGNOSIS — R269 Unspecified abnormalities of gait and mobility: Secondary | ICD-10-CM | POA: Diagnosis not present

## 2020-07-01 DIAGNOSIS — R7989 Other specified abnormal findings of blood chemistry: Secondary | ICD-10-CM | POA: Diagnosis not present

## 2020-12-21 DIAGNOSIS — R296 Repeated falls: Secondary | ICD-10-CM | POA: Diagnosis not present

## 2020-12-21 DIAGNOSIS — R7989 Other specified abnormal findings of blood chemistry: Secondary | ICD-10-CM | POA: Diagnosis not present

## 2020-12-21 DIAGNOSIS — E119 Type 2 diabetes mellitus without complications: Secondary | ICD-10-CM | POA: Diagnosis not present

## 2020-12-21 DIAGNOSIS — R269 Unspecified abnormalities of gait and mobility: Secondary | ICD-10-CM | POA: Diagnosis not present

## 2020-12-21 DIAGNOSIS — I10 Essential (primary) hypertension: Secondary | ICD-10-CM | POA: Diagnosis not present

## 2021-01-22 ENCOUNTER — Other Ambulatory Visit: Payer: Self-pay | Admitting: Neurology

## 2021-08-24 ENCOUNTER — Emergency Department (HOSPITAL_COMMUNITY): Payer: Medicare PPO

## 2021-08-24 ENCOUNTER — Encounter (HOSPITAL_COMMUNITY): Payer: Self-pay | Admitting: Emergency Medicine

## 2021-08-24 ENCOUNTER — Other Ambulatory Visit: Payer: Self-pay

## 2021-08-24 ENCOUNTER — Inpatient Hospital Stay (HOSPITAL_COMMUNITY)
Admission: EM | Admit: 2021-08-24 | Discharge: 2021-08-31 | DRG: 281 | Disposition: A | Discharge status: Skilled Nursing Facility | Payer: Medicare PPO | Attending: Internal Medicine | Admitting: Internal Medicine

## 2021-08-24 DIAGNOSIS — Z7984 Long term (current) use of oral hypoglycemic drugs: Secondary | ICD-10-CM

## 2021-08-24 DIAGNOSIS — E876 Hypokalemia: Secondary | ICD-10-CM | POA: Diagnosis not present

## 2021-08-24 DIAGNOSIS — Z8719 Personal history of other diseases of the digestive system: Secondary | ICD-10-CM

## 2021-08-24 DIAGNOSIS — I25119 Atherosclerotic heart disease of native coronary artery with unspecified angina pectoris: Secondary | ICD-10-CM | POA: Diagnosis not present

## 2021-08-24 DIAGNOSIS — R269 Unspecified abnormalities of gait and mobility: Secondary | ICD-10-CM | POA: Diagnosis present

## 2021-08-24 DIAGNOSIS — Y92009 Unspecified place in unspecified non-institutional (private) residence as the place of occurrence of the external cause: Secondary | ICD-10-CM

## 2021-08-24 DIAGNOSIS — R410 Disorientation, unspecified: Secondary | ICD-10-CM | POA: Diagnosis not present

## 2021-08-24 DIAGNOSIS — N400 Enlarged prostate without lower urinary tract symptoms: Secondary | ICD-10-CM | POA: Diagnosis present

## 2021-08-24 DIAGNOSIS — R296 Repeated falls: Secondary | ICD-10-CM | POA: Diagnosis present

## 2021-08-24 DIAGNOSIS — Z7982 Long term (current) use of aspirin: Secondary | ICD-10-CM | POA: Diagnosis not present

## 2021-08-24 DIAGNOSIS — I25118 Atherosclerotic heart disease of native coronary artery with other forms of angina pectoris: Secondary | ICD-10-CM | POA: Diagnosis present

## 2021-08-24 DIAGNOSIS — Z20822 Contact with and (suspected) exposure to covid-19: Secondary | ICD-10-CM | POA: Diagnosis not present

## 2021-08-24 DIAGNOSIS — I442 Atrioventricular block, complete: Secondary | ICD-10-CM | POA: Diagnosis not present

## 2021-08-24 DIAGNOSIS — I213 ST elevation (STEMI) myocardial infarction of unspecified site: Secondary | ICD-10-CM | POA: Diagnosis not present

## 2021-08-24 DIAGNOSIS — Z79899 Other long term (current) drug therapy: Secondary | ICD-10-CM

## 2021-08-24 DIAGNOSIS — M6281 Muscle weakness (generalized): Secondary | ICD-10-CM | POA: Diagnosis not present

## 2021-08-24 DIAGNOSIS — W19XXXA Unspecified fall, initial encounter: Secondary | ICD-10-CM | POA: Diagnosis present

## 2021-08-24 DIAGNOSIS — E872 Acidosis, unspecified: Secondary | ICD-10-CM | POA: Diagnosis present

## 2021-08-24 DIAGNOSIS — Z9079 Acquired absence of other genital organ(s): Secondary | ICD-10-CM | POA: Diagnosis not present

## 2021-08-24 DIAGNOSIS — R918 Other nonspecific abnormal finding of lung field: Secondary | ICD-10-CM | POA: Diagnosis not present

## 2021-08-24 DIAGNOSIS — F0390 Unspecified dementia without behavioral disturbance: Secondary | ICD-10-CM | POA: Diagnosis not present

## 2021-08-24 DIAGNOSIS — I249 Acute ischemic heart disease, unspecified: Secondary | ICD-10-CM | POA: Diagnosis not present

## 2021-08-24 DIAGNOSIS — F03C0 Unspecified dementia, severe, without behavioral disturbance, psychotic disturbance, mood disturbance, and anxiety: Secondary | ICD-10-CM | POA: Diagnosis not present

## 2021-08-24 DIAGNOSIS — I214 Non-ST elevation (NSTEMI) myocardial infarction: Secondary | ICD-10-CM | POA: Diagnosis not present

## 2021-08-24 DIAGNOSIS — I10 Essential (primary) hypertension: Secondary | ICD-10-CM | POA: Diagnosis not present

## 2021-08-24 DIAGNOSIS — I236 Thrombosis of atrium, auricular appendage, and ventricle as current complications following acute myocardial infarction: Secondary | ICD-10-CM | POA: Diagnosis not present

## 2021-08-24 DIAGNOSIS — R413 Other amnesia: Secondary | ICD-10-CM | POA: Diagnosis present

## 2021-08-24 DIAGNOSIS — R4189 Other symptoms and signs involving cognitive functions and awareness: Secondary | ICD-10-CM | POA: Diagnosis present

## 2021-08-24 DIAGNOSIS — I493 Ventricular premature depolarization: Secondary | ICD-10-CM | POA: Diagnosis present

## 2021-08-24 DIAGNOSIS — I513 Intracardiac thrombosis, not elsewhere classified: Secondary | ICD-10-CM | POA: Diagnosis not present

## 2021-08-24 DIAGNOSIS — I2109 ST elevation (STEMI) myocardial infarction involving other coronary artery of anterior wall: Secondary | ICD-10-CM | POA: Diagnosis not present

## 2021-08-24 DIAGNOSIS — E119 Type 2 diabetes mellitus without complications: Secondary | ICD-10-CM | POA: Diagnosis not present

## 2021-08-24 DIAGNOSIS — Z66 Do not resuscitate: Secondary | ICD-10-CM | POA: Diagnosis not present

## 2021-08-24 DIAGNOSIS — Z9181 History of falling: Secondary | ICD-10-CM | POA: Diagnosis not present

## 2021-08-24 DIAGNOSIS — Z87891 Personal history of nicotine dependence: Secondary | ICD-10-CM | POA: Diagnosis not present

## 2021-08-24 DIAGNOSIS — I420 Dilated cardiomyopathy: Secondary | ICD-10-CM | POA: Diagnosis present

## 2021-08-24 DIAGNOSIS — F02C0 Dementia in other diseases classified elsewhere, severe, without behavioral disturbance, psychotic disturbance, mood disturbance, and anxiety: Secondary | ICD-10-CM | POA: Diagnosis not present

## 2021-08-24 DIAGNOSIS — I2510 Atherosclerotic heart disease of native coronary artery without angina pectoris: Secondary | ICD-10-CM | POA: Diagnosis not present

## 2021-08-24 DIAGNOSIS — R5381 Other malaise: Secondary | ICD-10-CM | POA: Diagnosis not present

## 2021-08-24 DIAGNOSIS — R4182 Altered mental status, unspecified: Secondary | ICD-10-CM | POA: Diagnosis not present

## 2021-08-24 DIAGNOSIS — I2102 ST elevation (STEMI) myocardial infarction involving left anterior descending coronary artery: Secondary | ICD-10-CM | POA: Diagnosis not present

## 2021-08-24 DIAGNOSIS — I255 Ischemic cardiomyopathy: Secondary | ICD-10-CM | POA: Diagnosis not present

## 2021-08-24 DIAGNOSIS — R059 Cough, unspecified: Secondary | ICD-10-CM | POA: Diagnosis not present

## 2021-08-24 DIAGNOSIS — R0682 Tachypnea, not elsewhere classified: Secondary | ICD-10-CM | POA: Diagnosis present

## 2021-08-24 DIAGNOSIS — G4730 Sleep apnea, unspecified: Secondary | ICD-10-CM | POA: Diagnosis present

## 2021-08-24 DIAGNOSIS — R001 Bradycardia, unspecified: Secondary | ICD-10-CM | POA: Diagnosis not present

## 2021-08-24 DIAGNOSIS — I700 Atherosclerosis of aorta: Secondary | ICD-10-CM | POA: Diagnosis not present

## 2021-08-24 DIAGNOSIS — Z7401 Bed confinement status: Secondary | ICD-10-CM | POA: Diagnosis not present

## 2021-08-24 DIAGNOSIS — G309 Alzheimer's disease, unspecified: Secondary | ICD-10-CM | POA: Diagnosis not present

## 2021-08-24 LAB — I-STAT CHEM 8, ED
BUN: 25 mg/dL — ABNORMAL HIGH (ref 8–23)
Calcium, Ion: 1.09 mmol/L — ABNORMAL LOW (ref 1.15–1.40)
Chloride: 103 mmol/L (ref 98–111)
Creatinine, Ser: 1.1 mg/dL (ref 0.61–1.24)
Glucose, Bld: 129 mg/dL — ABNORMAL HIGH (ref 70–99)
HCT: 47 % (ref 39.0–52.0)
Hemoglobin: 16 g/dL (ref 13.0–17.0)
Potassium: 3 mmol/L — ABNORMAL LOW (ref 3.5–5.1)
Sodium: 139 mmol/L (ref 135–145)
TCO2: 24 mmol/L (ref 22–32)

## 2021-08-24 LAB — PROTIME-INR
INR: 1.1 (ref 0.8–1.2)
Prothrombin Time: 13.9 seconds (ref 11.4–15.2)

## 2021-08-24 LAB — COMPREHENSIVE METABOLIC PANEL
ALT: 23 U/L (ref 0–44)
AST: 100 U/L — ABNORMAL HIGH (ref 15–41)
Albumin: 3.5 g/dL (ref 3.5–5.0)
Alkaline Phosphatase: 58 U/L (ref 38–126)
Anion gap: 15 (ref 5–15)
BUN: 21 mg/dL (ref 8–23)
CO2: 21 mmol/L — ABNORMAL LOW (ref 22–32)
Calcium: 9.4 mg/dL (ref 8.9–10.3)
Chloride: 103 mmol/L (ref 98–111)
Creatinine, Ser: 1.42 mg/dL — ABNORMAL HIGH (ref 0.61–1.24)
GFR, Estimated: 48 mL/min — ABNORMAL LOW (ref >60)
Glucose, Bld: 130 mg/dL — ABNORMAL HIGH (ref 70–99)
Potassium: 3.1 mmol/L — ABNORMAL LOW (ref 3.5–5.1)
Sodium: 139 mmol/L (ref 135–145)
Total Bilirubin: 0.8 mg/dL (ref 0.3–1.2)
Total Protein: 6.3 g/dL — ABNORMAL LOW (ref 6.5–8.1)

## 2021-08-24 LAB — CBC WITH DIFFERENTIAL/PLATELET
Abs Immature Granulocytes: 0.03 10*3/uL (ref 0.00–0.07)
Basophils Absolute: 0.1 10*3/uL (ref 0.0–0.1)
Basophils Relative: 1 %
Eosinophils Absolute: 0.1 10*3/uL (ref 0.0–0.5)
Eosinophils Relative: 1 %
HCT: 46.1 % (ref 39.0–52.0)
Hemoglobin: 15.5 g/dL (ref 13.0–17.0)
Immature Granulocytes: 0 %
Lymphocytes Relative: 19 %
Lymphs Abs: 2 10*3/uL (ref 0.7–4.0)
MCH: 30 pg (ref 26.0–34.0)
MCHC: 33.6 g/dL (ref 30.0–36.0)
MCV: 89.2 fL (ref 80.0–100.0)
Monocytes Absolute: 1.1 10*3/uL — ABNORMAL HIGH (ref 0.1–1.0)
Monocytes Relative: 10 %
Neutro Abs: 7.2 10*3/uL (ref 1.7–7.7)
Neutrophils Relative %: 69 %
Platelets: 304 10*3/uL (ref 150–400)
RBC: 5.17 MIL/uL (ref 4.22–5.81)
RDW: 12.4 % (ref 11.5–15.5)
WBC: 10.5 10*3/uL (ref 4.0–10.5)
nRBC: 0 % (ref 0.0–0.2)

## 2021-08-24 LAB — TROPONIN I (HIGH SENSITIVITY)
Troponin I (High Sensitivity): 12524 ng/L (ref <18)
Troponin I (High Sensitivity): 16651 ng/L (ref <18)

## 2021-08-24 LAB — RESP PANEL BY RT-PCR (FLU A&B, COVID) ARPGX2
Influenza A by PCR: NEGATIVE
Influenza B by PCR: NEGATIVE
SARS Coronavirus 2 by RT PCR: NEGATIVE

## 2021-08-24 LAB — LACTIC ACID, PLASMA
Lactic Acid, Venous: 2.2 mmol/L (ref 0.5–1.9)
Lactic Acid, Venous: 1.4 mmol/L (ref 0.5–1.9)

## 2021-08-24 LAB — LIPASE, BLOOD: Lipase: 40 U/L (ref 11–51)

## 2021-08-24 LAB — BRAIN NATRIURETIC PEPTIDE: B Natriuretic Peptide: 121.7 pg/mL — ABNORMAL HIGH (ref 0.0–100.0)

## 2021-08-24 MED ORDER — POTASSIUM CHLORIDE 10 MEQ/100ML IV SOLN
10.0000 meq | INTRAVENOUS | Status: AC
  Administered 2021-08-24 – 2021-08-25 (×3): 10 meq via INTRAVENOUS
  Filled 2021-08-24 (×3): qty 100

## 2021-08-24 MED ORDER — HEPARIN BOLUS VIA INFUSION
4000.0000 [IU] | Freq: Once | INTRAVENOUS | Status: AC
  Administered 2021-08-24: 4000 [IU] via INTRAVENOUS
  Filled 2021-08-24: qty 4000

## 2021-08-24 MED ORDER — ASPIRIN 81 MG PO CHEW
324.0000 mg | CHEWABLE_TABLET | Freq: Once | ORAL | Status: AC
  Administered 2021-08-24: 324 mg via ORAL
  Filled 2021-08-24: qty 4

## 2021-08-24 MED ORDER — ACETAMINOPHEN 650 MG RE SUPP: 650.0000 mg | Freq: Four times a day (QID) | RECTAL | Status: DC | PRN

## 2021-08-24 MED ORDER — ACETAMINOPHEN 325 MG PO TABS
650.0000 mg | ORAL_TABLET | Freq: Four times a day (QID) | ORAL | Status: DC | PRN
  Administered 2021-08-27: 650 mg via ORAL
  Filled 2021-08-24: qty 2

## 2021-08-24 MED ORDER — HEPARIN (PORCINE) 25000 UT/250ML-% IV SOLN
1050.0000 [IU]/h | INTRAVENOUS | Status: DC
Start: 2021-08-24 — End: 2021-08-25
  Administered 2021-08-24: 900 [IU]/h via INTRAVENOUS
  Filled 2021-08-24 (×2): qty 250

## 2021-08-24 NOTE — Progress Notes (Signed)
ANTICOAGULATION CONSULT NOTE - Initial Consult ? ?Pharmacy Consult for heparin ?Indication: chest pain/ACS ? ?No Known Allergies ? ?Patient Measurements: ?Height: '5\' 8"'$  (172.7 cm) ?Weight: 73 kg (160 lb 15 oz) ?IBW/kg (Calculated) : 68.4 ?Heparin Dosing Weight: 73 kg ? ?Vital Signs: ?Temp: 98.3 ?F (36.8 ?C) (03/23 1908) ?BP: 120/94 (03/23 2015) ?Pulse Rate: 94 (03/23 2015) ? ?Labs: ?Recent Labs  ?  08/24/21 ?1925 08/24/21 ?1942 08/24/21 ?1955  ?HGB 15.5 16.0  --   ?HCT 46.1 47.0  --   ?PLT 304  --   --   ?LABPROT  --   --  13.9  ?INR  --   --  1.1  ?CREATININE 1.42* 1.10  --   ?TROPONINIHS 12,524*  --   --   ? ? ?Estimated Creatinine Clearance: 46.6 mL/min (by C-G formula based on SCr of 1.1 mg/dL). ? ? ?Medical History: ?Past Medical History:  ?Diagnosis Date  ? BPH (benign prostatic hypertrophy)   ? Cataract   ? bil catarCTS REMOVED  ? Colon polyps   ? Diabetes mellitus   ? Gait abnormality 10/27/2019  ? Hernia   ? abdominal/ventral  ? Memory disorder 10/27/2019  ? Memory loss   ? Sleep apnea   ? last study 5 years ago- per patient SEVERE.  wears c_pap  ? ? ?Medications:  ?(Not in a hospital admission)  ?Scheduled:  ? aspirin  324 mg Oral Once  ? ?Infusions:  ? ?Assessment: ?Patient presented after multiple fall. Pharmacy consulted to start heparin. Troponin is 12,524, lactic acid is 2.2, vitals and CBC are stable. F/u plans for anticoagulation during ACS work-up. ? ?Patient not taking anticoagulation PTA. ? ?Goal of Therapy:  ?Heparin level 0.3-0.7 units/ml ?Monitor platelets by anticoagulation protocol: Yes ?  ?Plan:  ?Bolus with 4000 units of heparin ?Start heparin at 900 units/hour ?8 hour heparin level ?Daily heparin level and CBC ? ? ?Ursula Beath ?08/24/2021,8:35 PM ? ? ?

## 2021-08-24 NOTE — ED Notes (Signed)
MD Pfeiffer and primary RN notified of critical troponin  ?

## 2021-08-24 NOTE — ED Triage Notes (Signed)
Pt bib [facility] from home for altered mental status. Per daughter, pt has had an increased number of falls this week and may have potentially hit his head 3 days ago. No thinners. Pt did fall today and was only able to say his name and dob. Baseline a&Ox4.  ? ?BP 170/120, HR 85, CBG 147 ?

## 2021-08-24 NOTE — ED Notes (Signed)
Patient transported to CT with RN 

## 2021-08-24 NOTE — ED Provider Notes (Signed)
?[facility] ?Provider Note ? ? ?CSN: [id number] ?Arrival date & time: 08/24/21  1900 ? ?  ? ?History ? ?Chief Complaint  ?Patient presents with  ? Altered Mental Status  ? ? ?Gregory Lamb is a 88 y.o. male. ? ?HPI ?Patient lives at home with his wife.  He has fairly advanced dementia but has been in good physical condition.  His wife has mildly present dementia.  Patient's daughter is present as historian.  She lives nearby and is frequently checking on them and assisting. ? ?Patient daughter reports over the past week he has seemed to be somewhat more confused than usual.  Some subtle behavior changes.  He has fallen a couple of times which is not typical.  No specific injuries have been noted but it was atypical for him to have falls and be more unsteady.  Patient denies any complaints.  He endorses that he feels well.  He denies chest pain or abdominal pain.  Notably, the patient is pleasant and interactive but does not seem to have recall for any historical events or present situation. ? ?Patient is brought in by EMS. ?  ? ?Home Medications ?Prior to Admission medications   ?Medication Sig Start Date End Date Taking? Authorizing Provider  ?aspirin EC 81 MG tablet Take 81 mg by mouth daily.    [provider]  ?azelastine (ASTELIN) 0.1 % nasal spray Place 1 spray into both nostrils 2 (two) times daily. Use in each nostril as directed    [provider]  ?CALCIUM-VITAMIN D PO Take by mouth.    [provider]  ?diphenhydrAMINE (BENADRYL) 25 MG tablet Take 25 mg by mouth daily as needed. Allergies    [provider]  ?donepezil (ARICEPT) 10 MG tablet Take 1 tablet (10 mg total) by mouth daily. 10/27/19   Kathrynn Ducking, MD  ?losartan (COZAAR) 25 MG tablet Take 25 mg by mouth daily. Takes 1/2 tablet daily    [provider]  ?Melatonin 3 MG CAPS Take 3 mg by mouth as needed.     [provider]  ?memantine (NAMENDA) 5  MG tablet Take 5 mg by mouth 2 (two) times daily. 08/18/19   [provider]  ?metFORMIN (GLUCOPHAGE) 500 MG tablet Take 500 mg by mouth 2 (two) times daily with a meal.    [provider]  ?Omega-3 Fatty Acids (FISH OIL) 1200 MG CAPS Take 1,200 mg by mouth daily.    [provider]  ?Tamsulosin HCl (FLOMAX) 0.4 MG CAPS Take 1 capsule (0.4 mg total) by mouth daily after supper. 12/13/11   Franchot Gallo, MD  ?   ? ?Allergies    ?Patient has no known allergies.   ? ?Review of Systems   ?Review of Systems ?Level 5 caveat cannot obtain review of systems due to dementia ?Physical Exam ?Updated Vital Signs ?BP (!) 167/102   Pulse 82   Temp 98.3 ?F (36.8 ?C)   Resp 15   Ht '5\' 8"'$  (1.727 m)   Wt 73 kg   SpO2 95%   BMI 24.47 kg/m?  ?Physical Exam ?Constitutional:   ?   Comments: Well-nourished well-developed 88 year old.  Patient is alert and verbally interactive.  Situationally confused.  ?HENT:  ?   Head: Normocephalic and atraumatic.  ?   Mouth/Throat:  ?   Pharynx: Oropharynx is clear.  ?Eyes:  ?   Extraocular Movements: Extraocular movements intact.  ?Cardiovascular:  ?   Rate and Rhythm:  Normal rate and regular rhythm.  ?Pulmonary:  ?   Comments: Faint basilar crackle.  No respiratory distress at rest. ?Abdominal:  ?   General: There is no distension.  ?   Palpations: Abdomen is soft.  ?   Tenderness: There is no abdominal tenderness. There is no guarding.  ?Musculoskeletal:     ?   General: No swelling. Normal range of motion.  ?   Right lower leg: No edema.  ?   Left lower leg: No edema.  ?Skin: ?   General: Skin is warm and dry.  ?Neurological:  ?   General: No focal deficit present.  ?   Motor: No weakness.  ?   Comments: Patient is moving about the stretcher.  He is using all of his extremities to shuffle down to the bottom of the stretcher.  His words are clear but speech content reflects lack of situational comprehension.  ? ? ?ED Results / Procedures / Treatments    ?Labs ?(all labs ordered are listed, but only abnormal results are displayed) ?Labs Reviewed - No data to display ? ?EKG ?EKG Interpretation ? ?Date/Time:  Thursday August 24 2021 19:07:32 EDT ?Ventricular Rate:  82 ?PR Interval:  215 ?QRS Duration: 79 ?QT Interval:  356 ?QTC Calculation: 416 ?R Axis:   -59 ?Text Interpretation: Sinus rhythm Ventricular premature complex Borderline prolonged PR interval Inferior infarct, old Probable anterolateral infarct, acute >>> Acute MI <<< Confirmed by Pattricia Boss [phone] on 08/25/2021 12:25:41 PM ? ?Radiology ?CT Head Wo Contrast ? ?Result Date: 08/24/2021 ?CLINICAL DATA:  Mental status change, unknown cause EXAM: CT HEAD WITHOUT CONTRAST TECHNIQUE: Contiguous axial images were obtained from the base of the skull through the vertex without intravenous contrast. RADIATION DOSE REDUCTION: This exam was performed according to the departmental dose-optimization program which includes automated exposure control, adjustment of the mA and/or kV according to patient size and/or use of iterative reconstruction technique. COMPARISON:  CT head Oct 15, 2016.  MRI head November 06, 2019. FINDINGS: Brain: No evidence of acute infarction, acute hemorrhage, hydrocephalus, extra-axial collection or mass lesion/mass effect. Cerebral atrophy with mesial temporal predominance. Associated ex vacuo ventricular dilation. Mild patchy white matter hypoattenuation, nonspecific but compatible with chronic microvascular ischemic disease. Vascular: No hyperdense vessel identified. Skull: No acute orbital findings. Sinuses/Orbits: Small probable retention cyst in the right sphenoid sinus. Otherwise, clear sinuses. No acute orbital findings. Other: No mastoid effusions. IMPRESSION: 1. No evidence of acute intracranial abnormality. 2. Cerebral atrophy with mesial temporal predominance, which is nonspecific but can be seen with Alzheimer's disease. Electronically Signed   By: Margaretha Sheffield M.D.   On:  08/24/2021 20:49  ? ?DG Chest Port 1 View ? ?Result Date: 08/24/2021 ?CLINICAL DATA:  Mental status change EXAM: PORTABLE CHEST 1 VIEW COMPARISON:  Radiograph 12/10/2011 FINDINGS: Unchanged cardiomediastinal silhouette. Left basilar opacities. Streaky right basilar opacities. No large pleural effusion. No visible pneumothorax. No acute osseous abnormality. IMPRESSION: Left basilar opacity and streaky right basilar opacities, which could be atelectasis or pneumonia. Electronically Signed   By: Maurine Simmering M.D.   On: 08/24/2021 20:03   ? ?Procedures ?Procedures  ? ?CRITICAL CARE ?Performed by: Charlesetta Shanks ? ? ?Total critical care time: 45 minutes ? ?Critical care time was exclusive of separately billable procedures and treating other patients. ? ?Critical care was necessary to treat or prevent imminent or life-threatening deterioration. ? ?Critical care was time spent personally by me on the following activities: development of treatment plan with patient and/or surrogate  as well as nursing, discussions with consultants, evaluation of patient's response to treatment, examination of patient, obtaining history from patient or surrogate, ordering and performing treatments and interventions, ordering and review of laboratory studies, ordering and review of radiographic studies, pulse oximetry and re-evaluation of patient's condition.  ?Medications Ordered in ED ?Medications - No data to display ? ?ED Course/ Medical Decision Making/ A&P ?  ?                        ?Medical Decision Making ?Amount and/or Complexity of Data Reviewed ?Labs: ordered. ?Radiology: ordered. ? ?Risk ?OTC drugs. ?Prescription drug management. ?Decision regarding hospitalization. ? ?Patient presents by EMS.  Patient is a poor historian.  He is alert and pleasant but is not giving any historical information.  Additional history consists of more recent confusion and falls at home with history of dementia.  EMS does have a EKG from transport.  This  EKG shows anterior ST elevations without R waves.  Ischemic appearance.  Will include cardiac work-up in altered mental status work-up for elderly patient.  We will proceed with CT head, broad diagnostic studies including urina

## 2021-08-24 NOTE — Consult Note (Signed)
?Cardiology Consultation:  ? ?Patient ID: Gregory Lamb ?MRN: [id number]; DOB: 01/05/1934 ? ?Admit date: 08/24/2021 ?Date of Consult: 08/24/2021 ? ?PCP:  Gregory Morning, DO ?  ?[facility] Providers ?Cardiologist:  None      ? ? ?Patient Profile:  ? ?Gregory Lamb is a 88 y.o. male with a hx of T2DM, advanced cognitive impairment, and gait abnormality who is being seen 08/24/2021 for the evaluation of elevated troponins and EKG changes at the request of Dr. Johnney Lamb. ? ?History of Present Illness:  ? ?Gregory Lamb is seen in the room with his family (wife and daughter) at bedside. The patient was brought in due to AMS and multiple unwitnessed falls at home in the last week. Patient only knows his name, cannot answer simple questions, disoriented to time and place. Per the family, patient has a hx of gait abnormality chronically and had multiple falls at home in the last week. Denies syncope, chest pain, dyspnea, nausea, vomiting or diuresis. The pt has been off any medications for approximately two months. When I asked the patient "anything bothering you", the patient says "I am doing great". He is on room air, HDS, no acute distress, smiling when he is asked questions.  ? ?On admission, ECG demonstrated STE anterolateral leads with Q wave. Per Dr. Johnney Lamb she has spoken with Dr. Irish Lamb.  ? ?Past Medical History:  ?Diagnosis Date  ? BPH (benign prostatic hypertrophy)   ? Cataract   ? bil catarCTS REMOVED  ? Colon polyps   ? Diabetes mellitus   ? Gait abnormality 10/27/2019  ? Hernia   ? abdominal/ventral  ? Memory disorder 10/27/2019  ? Memory loss   ? Sleep apnea   ? last study 5 years ago- per patient SEVERE.  wears c_pap  ? ? ?Past Surgical History:  ?Procedure Laterality Date  ? COLONOSCOPY    ? EYE SURGERY    ? cataract extraction with IOL  ? hard of hearing    ? wear bil haring aids  ? LIPOMA EXCISION    ? back  ? POLYPECTOMY    ? TONSILLECTOMY    ? TRANSURETHRAL RESECTION OF PROSTATE  12/12/2011  ?  Procedure: TRANSURETHRAL RESECTION OF THE PROSTATE WITH GYRUS INSTRUMENTS;  Surgeon: Franchot Gallo, MD;  Location: [facility];  Service: Urology;  Laterality: N/A;  ? UPPER GASTROINTESTINAL ENDOSCOPY    ?  ? ? ? ?Inpatient Medications: ?Scheduled Meds: ? ?Continuous Infusions: ? heparin 900 Units/hr (08/24/21 2105)  ? potassium chloride 10 mEq (08/24/21 2235)  ? ?PRN Meds: ? ? ?Allergies:   No Known Allergies ? ?Social History:   ?Social History  ? ?Socioeconomic History  ? Marital status: Married  ?  Spouse name: Not on file  ? Number of children: 4  ? Years of education: Not on file  ? Highest education level: Not on file  ?Occupational History  ? Not on file  ?Tobacco Use  ? Smoking status: Former  ?  Packs/day: 1.00  ?  Years: 50.00  ?  Pack years: 50.00  ?  Types: Cigarettes  ?  Quit date: 12/10/1979  ?  Years since quitting: 41.7  ? Smokeless tobacco: Former  ? Tobacco comments:  ?  quit in 1980's or 1990's  ?Vaping Use  ? Vaping Use: Never used  ?Substance and Sexual Activity  ? Alcohol use: Yes  ?  Alcohol/week: 7.0 standard drinks  ?  Types: 7 Cans of beer per week  ?  Comment: daily glass  wine or beer  ? Drug use: No  ? Sexual activity: Not on file  ?Other Topics Concern  ? Not on file  ?Social History Narrative  ? Not on file  ? ?Social Determinants of Health  ? ?Financial Resource Strain: Not on file  ?Food Insecurity: Not on file  ?Transportation Needs: Not on file  ?Physical Activity: Not on file  ?Stress: Not on file  ?Social Connections: Not on file  ?Intimate Partner Violence: Not on file  ?  ?Family History:   ? ?Family History  ?Problem Relation Age of Onset  ? Colon cancer Neg Hx   ? Esophageal cancer Neg Hx   ? Liver cancer Neg Hx   ? Pancreatic cancer Neg Hx   ? Prostate cancer Neg Hx   ? Rectal cancer Neg Hx   ? Stomach cancer Neg Hx   ?  ? ?ROS:  ?Please see the history of present illness.  ? ?All other ROS reviewed and negative.    ? ?Physical Exam/Data:  ? ?Vitals:  ? 08/24/21 2030 08/24/21  2115 08/24/21 2130 08/24/21 2230  ?BP: (!) 141/110 (!) 172/133 (!) 160/99 (!) 157/74  ?Pulse: 75 68 (!) 58 (!) 59  ?Resp: (!) 21 19 (!) 23 (!) 21  ?Temp:      ?SpO2: 97% 100% 97% 95%  ?Weight:      ?Height:      ? ?No intake or output data in the 24 hours ending 08/24/21 2355 ? ?  08/24/2021  ?  7:08 PM 10/27/2019  ?  8:11 AM 09/25/2017  ?  9:08 AM  ?Last 3 Weights  ?Weight (lbs) 160 lb 15 oz 161 lb 171 lb  ?Weight (kg) 73 kg 73.029 kg 77.565 kg  ?   ?Body mass index is 24.47 kg/m?.  ?General:  no acute distress ?HEENT: normal ?Neck: no JVD ?Vascular: No carotid bruits; Distal pulses 2+ bilaterally ?Cardiac:  normal S1, S2; RRR; no murmur  ?Lungs:  clear to auscultation bilaterally, no wheezing, rhonchi or rales  ?Abd: soft, nontender, no hepatomegaly  ?Ext: no edema ?Musculoskeletal:  No deformities, BUE and BLE strength normal and equal ?Skin: warm and dry  ?Neuro:  CNs 2-12 intact, no focal abnormalities noted ?Psych:  unable to assess ? ?Relevant CV Studies: ? ? ?Laboratory Data: ? ?High Sensitivity Troponin:   ?Recent Labs  ?Lab 08/24/21 ?1925 08/24/21 ?2111  ?TROPONINIHS 16,109* [id number]*  ?   ?Chemistry ?Recent Labs  ?Lab 08/24/21 ?1925 08/24/21 ?1942  ?NA 139 139  ?K 3.1* 3.0*  ?CL 103 103  ?CO2 21*  --   ?GLUCOSE 130* 129*  ?BUN 21 25*  ?CREATININE 1.42* 1.10  ?CALCIUM 9.4  --   ?GFRNONAA 48*  --   ?ANIONGAP 15  --   ?  ?Recent Labs  ?Lab 08/24/21 ?1925  ?PROT 6.3*  ?ALBUMIN 3.5  ?AST 100*  ?ALT 23  ?ALKPHOS 58  ?BILITOT 0.8  ? ?Lipids No results for input(s): CHOL, TRIG, HDL, LABVLDL, LDLCALC, CHOLHDL in the last 168 hours.  ?Hematology ?Recent Labs  ?Lab 08/24/21 ?1925 08/24/21 ?1942  ?WBC 10.5  --   ?RBC 5.17  --   ?HGB 15.5 16.0  ?HCT 46.1 47.0  ?MCV 89.2  --   ?MCH 30.0  --   ?MCHC 33.6  --   ?RDW 12.4  --   ?PLT 304  --   ? ?Thyroid No results for input(s): TSH, FREET4 in the last 168 hours.  ?BNP ?Recent Labs  ?Lab  08/24/21 ?1925  ?BNP 121.7*  ?  ?DDimer No results for input(s): DDIMER in the last 168  hours. ? ? ?Radiology/Studies:  ?CT Head Wo Contrast ? ?Result Date: 08/24/2021 ?CLINICAL DATA:  Mental status change, unknown cause EXAM: CT HEAD WITHOUT CONTRAST TECHNIQUE: Contiguous axial images were obtained from the base of the skull through the vertex without intravenous contrast. RADIATION DOSE REDUCTION: This exam was performed according to the departmental dose-optimization program which includes automated exposure control, adjustment of the mA and/or kV according to patient size and/or use of iterative reconstruction technique. COMPARISON:  CT head Oct 15, 2016.  MRI head November 06, 2019. FINDINGS: Brain: No evidence of acute infarction, acute hemorrhage, hydrocephalus, extra-axial collection or mass lesion/mass effect. Cerebral atrophy with mesial temporal predominance. Associated ex vacuo ventricular dilation. Mild patchy white matter hypoattenuation, nonspecific but compatible with chronic microvascular ischemic disease. Vascular: No hyperdense vessel identified. Skull: No acute orbital findings. Sinuses/Orbits: Small probable retention cyst in the right sphenoid sinus. Otherwise, clear sinuses. No acute orbital findings. Other: No mastoid effusions. IMPRESSION: 1. No evidence of acute intracranial abnormality. 2. Cerebral atrophy with mesial temporal predominance, which is nonspecific but can be seen with Alzheimer's disease. Electronically Signed   By: Margaretha Sheffield M.D.   On: 08/24/2021 20:49  ? ?DG Chest Port 1 View ? ?Result Date: 08/24/2021 ?CLINICAL DATA:  Mental status change EXAM: PORTABLE CHEST 1 VIEW COMPARISON:  Radiograph 12/10/2011 FINDINGS: Unchanged cardiomediastinal silhouette. Left basilar opacities. Streaky right basilar opacities. No large pleural effusion. No visible pneumothorax. No acute osseous abnormality. IMPRESSION: Left basilar opacity and streaky right basilar opacities, which could be atelectasis or pneumonia. Electronically Signed   By: Maurine Simmering M.D.   On: 08/24/2021  20:03   ? ? ?Assessment and Plan:  ? ?#Acute coronary syndrome ?#STEMI? ?-Patient has advanced cognitive impairment, denies any symptoms, on room air, no acute distress, EKG demonstrated  ?-Discussed with his wife

## 2021-08-25 ENCOUNTER — Inpatient Hospital Stay (HOSPITAL_COMMUNITY): Payer: Medicare PPO

## 2021-08-25 ENCOUNTER — Encounter (HOSPITAL_COMMUNITY): Payer: Self-pay | Admitting: Internal Medicine

## 2021-08-25 DIAGNOSIS — I255 Ischemic cardiomyopathy: Secondary | ICD-10-CM

## 2021-08-25 DIAGNOSIS — G309 Alzheimer's disease, unspecified: Secondary | ICD-10-CM

## 2021-08-25 DIAGNOSIS — I25119 Atherosclerotic heart disease of native coronary artery with unspecified angina pectoris: Secondary | ICD-10-CM

## 2021-08-25 DIAGNOSIS — I513 Intracardiac thrombosis, not elsewhere classified: Secondary | ICD-10-CM

## 2021-08-25 DIAGNOSIS — N400 Enlarged prostate without lower urinary tract symptoms: Secondary | ICD-10-CM | POA: Diagnosis present

## 2021-08-25 DIAGNOSIS — E119 Type 2 diabetes mellitus without complications: Secondary | ICD-10-CM

## 2021-08-25 DIAGNOSIS — I213 ST elevation (STEMI) myocardial infarction of unspecified site: Secondary | ICD-10-CM | POA: Diagnosis not present

## 2021-08-25 DIAGNOSIS — E872 Acidosis, unspecified: Secondary | ICD-10-CM | POA: Diagnosis present

## 2021-08-25 DIAGNOSIS — F03C0 Unspecified dementia, severe, without behavioral disturbance, psychotic disturbance, mood disturbance, and anxiety: Secondary | ICD-10-CM | POA: Diagnosis present

## 2021-08-25 DIAGNOSIS — I2510 Atherosclerotic heart disease of native coronary artery without angina pectoris: Secondary | ICD-10-CM

## 2021-08-25 DIAGNOSIS — E876 Hypokalemia: Secondary | ICD-10-CM | POA: Diagnosis present

## 2021-08-25 DIAGNOSIS — F02C0 Dementia in other diseases classified elsewhere, severe, without behavioral disturbance, psychotic disturbance, mood disturbance, and anxiety: Secondary | ICD-10-CM

## 2021-08-25 DIAGNOSIS — I236 Thrombosis of atrium, auricular appendage, and ventricle as current complications following acute myocardial infarction: Secondary | ICD-10-CM

## 2021-08-25 LAB — COMPREHENSIVE METABOLIC PANEL
ALT: 24 U/L (ref 0–44)
AST: 93 U/L — ABNORMAL HIGH (ref 15–41)
Albumin: 3.4 g/dL — ABNORMAL LOW (ref 3.5–5.0)
Alkaline Phosphatase: 64 U/L (ref 38–126)
Anion gap: 12 (ref 5–15)
BUN: 21 mg/dL (ref 8–23)
CO2: 19 mmol/L — ABNORMAL LOW (ref 22–32)
Calcium: 9.2 mg/dL (ref 8.9–10.3)
Chloride: 108 mmol/L (ref 98–111)
Creatinine, Ser: 1.02 mg/dL (ref 0.61–1.24)
GFR, Estimated: >60 mL/min (ref >60)
Glucose, Bld: 115 mg/dL — ABNORMAL HIGH (ref 70–99)
Potassium: 3.8 mmol/L (ref 3.5–5.1)
Sodium: 139 mmol/L (ref 135–145)
Total Bilirubin: 1.2 mg/dL (ref 0.3–1.2)
Total Protein: 6.5 g/dL (ref 6.5–8.1)

## 2021-08-25 LAB — LIPID PANEL
Cholesterol: 167 mg/dL (ref 0–200)
HDL: 40 mg/dL — ABNORMAL LOW (ref >40)
LDL Cholesterol: 115 mg/dL — ABNORMAL HIGH (ref 0–99)
Total CHOL/HDL Ratio: 4.2 RATIO
Triglycerides: 59 mg/dL (ref <150)
VLDL: 12 mg/dL (ref 0–40)

## 2021-08-25 LAB — URINALYSIS, ROUTINE W REFLEX MICROSCOPIC
Bilirubin Urine: NEGATIVE
Glucose, UA: NEGATIVE mg/dL
Hgb urine dipstick: NEGATIVE
Ketones, ur: 20 mg/dL — AB
Leukocytes,Ua: NEGATIVE
Nitrite: NEGATIVE
Protein, ur: NEGATIVE mg/dL
Specific Gravity, Urine: 1.026 (ref 1.005–1.030)
pH: 5 (ref 5.0–8.0)

## 2021-08-25 LAB — ECHOCARDIOGRAM COMPLETE
Area-P 1/2: 3.99 cm2
Height: 68 in
S' Lateral: 2.9 cm
Weight: 2574.97 oz

## 2021-08-25 LAB — MAGNESIUM: Magnesium: 1.9 mg/dL (ref 1.7–2.4)

## 2021-08-25 LAB — HEPARIN LEVEL (UNFRACTIONATED)
Heparin Unfractionated: 0.57 IU/mL (ref 0.30–0.70)
Heparin Unfractionated: 0.26 IU/mL — ABNORMAL LOW (ref 0.30–0.70)

## 2021-08-25 LAB — GLUCOSE, CAPILLARY
Glucose-Capillary: 144 mg/dL — ABNORMAL HIGH (ref 70–99)
Glucose-Capillary: 130 mg/dL — ABNORMAL HIGH (ref 70–99)

## 2021-08-25 LAB — HEMOGLOBIN A1C
Hgb A1c MFr Bld: 6.2 % — ABNORMAL HIGH (ref 4.8–5.6)
Mean Plasma Glucose: 131.24 mg/dL

## 2021-08-25 LAB — PROCALCITONIN: Procalcitonin: <0.1 ng/mL

## 2021-08-25 LAB — TROPONIN I (HIGH SENSITIVITY): Troponin I (High Sensitivity): 14686 ng/L (ref <18)

## 2021-08-25 LAB — CBG MONITORING, ED
Glucose-Capillary: 113 mg/dL — ABNORMAL HIGH (ref 70–99)
Glucose-Capillary: 160 mg/dL — ABNORMAL HIGH (ref 70–99)

## 2021-08-25 LAB — TSH: TSH: 0.473 u[IU]/mL (ref 0.350–4.500)

## 2021-08-25 MED ORDER — INSULIN ASPART 100 UNIT/ML IJ SOLN
0.0000 [IU] | Freq: Three times a day (TID) | INTRAMUSCULAR | Status: DC
  Administered 2021-08-25 – 2021-08-27 (×2): 1 [IU] via SUBCUTANEOUS
  Administered 2021-08-28 – 2021-08-31 (×3): 2 [IU] via SUBCUTANEOUS

## 2021-08-25 MED ORDER — ENOXAPARIN SODIUM 80 MG/0.8ML IJ SOSY
75.0000 mg | PREFILLED_SYRINGE | Freq: Two times a day (BID) | INTRAMUSCULAR | Status: DC
  Administered 2021-08-25 – 2021-08-27 (×4): 75 mg via SUBCUTANEOUS
  Filled 2021-08-25 (×5): qty 0.8

## 2021-08-25 MED ORDER — MEMANTINE HCL 5 MG PO TABS
5.0000 mg | ORAL_TABLET | Freq: Two times a day (BID) | ORAL | Status: DC
  Administered 2021-08-25 – 2021-08-31 (×12): 5 mg via ORAL
  Filled 2021-08-25 (×15): qty 1

## 2021-08-25 MED ORDER — DONEPEZIL HCL 10 MG PO TABS
10.0000 mg | ORAL_TABLET | Freq: Every day | ORAL | Status: DC
  Administered 2021-08-25 – 2021-08-31 (×7): 10 mg via ORAL
  Filled 2021-08-25 (×8): qty 1

## 2021-08-25 MED ORDER — ASPIRIN EC 81 MG PO TBEC
81.0000 mg | DELAYED_RELEASE_TABLET | Freq: Every day | ORAL | Status: DC
  Administered 2021-08-25 – 2021-08-27 (×3): 81 mg via ORAL
  Filled 2021-08-25 (×3): qty 1

## 2021-08-25 MED ORDER — CLOPIDOGREL BISULFATE 75 MG PO TABS
75.0000 mg | ORAL_TABLET | Freq: Every day | ORAL | Status: DC
  Administered 2021-08-25 – 2021-08-31 (×7): 75 mg via ORAL
  Filled 2021-08-25 (×7): qty 1

## 2021-08-25 MED ORDER — METOPROLOL TARTRATE 12.5 MG HALF TABLET
12.5000 mg | ORAL_TABLET | Freq: Two times a day (BID) | ORAL | Status: DC
  Administered 2021-08-25: 12.5 mg via ORAL
  Filled 2021-08-25: qty 1

## 2021-08-25 MED ORDER — PERFLUTREN LIPID MICROSPHERE
1.0000 mL | INTRAVENOUS | Status: AC | PRN
  Administered 2021-08-25: 4 mL via INTRAVENOUS

## 2021-08-25 MED ORDER — LOSARTAN POTASSIUM 25 MG PO TABS
25.0000 mg | ORAL_TABLET | Freq: Every day | ORAL | Status: DC
  Administered 2021-08-25 – 2021-08-27 (×3): 25 mg via ORAL
  Filled 2021-08-25 (×3): qty 1

## 2021-08-25 MED ORDER — TAMSULOSIN HCL 0.4 MG PO CAPS
0.4000 mg | ORAL_CAPSULE | Freq: Every day | ORAL | Status: DC
Start: 2021-08-25 — End: 2021-08-31
  Administered 2021-08-25 – 2021-08-30 (×5): 0.4 mg via ORAL
  Filled 2021-08-25 (×6): qty 1

## 2021-08-25 MED ORDER — MELATONIN 3 MG PO TABS
3.0000 mg | ORAL_TABLET | ORAL | Status: DC | PRN
  Administered 2021-08-25 – 2021-08-30 (×5): 3 mg via ORAL
  Filled 2021-08-25 (×6): qty 1

## 2021-08-25 NOTE — Progress Notes (Addendum)
?PROGRESS NOTE ? ? ? ?Gregory Lamb  JQZ:[id number] DOB: 10/06/1933 DOA: 08/24/2021 ?PCP: Janie Morning, DO  ? ? ?Brief Narrative:  ?Gregory Lamb is a 88 y.o. male with medical history significant for advanced dementia, type 2 diabetes mellitus, benign prostatic hyperplasia, presented to hospital after sustaining a fall unclear etiology.  In the ED patient had normal blood pressure.  Mildly tachypneic.  There was mild hypokalemia with potassium of 3.1 and creatinine of 1.4.  Troponin I was elevated at 12 500 with up to 16 600.  Lactate was elevated at 2.2.  CBC mildly elevated at 10.5 K.  EKG showed ST elevation in leads V3 through V6, all of which appear new relative to most recent prior EKG from July 2013.  Chest x-ray shows bibasilar opacities, concerning for atelectasis versus pneumonia.  Cardiology was consulted from the ED and the case was discussed with patient's daughter.  At this time, plan was to proceed with medical management and patient was started on heparin drip. ?  ?Assessment and Plan: ? ?STEMI (ST elevation myocardial infarction) ([place]) ?Patient presenting with a significantly elevated troponin with EKG changes suggestive of ST elevation MI.  Currently on heparin drip and conservative treatment.  Cardiology has seen the patient.  Family did not wish to proceed with invasive procedures.  Continue, aspirin, low-dose beta-blocker,  losartan but he is bradycardic so we will hold off with beta-blockers at this time.  2D echocardiogram from today shows LV ejection fraction of 40 to 45% with regional wall motion abnormality in the mid to apical septal walls with a small adherent LV thrombus.  I spoke about this with the patient's daughter.  Patient is currently on IV heparin.  Lipid panel noted with LDL of 115.  Magnesium 1.9. ?  ?DM2 (diabetes mellitus, type 2) ([place]) ?Continue sliding scale insulin.  Hemoglobin A1c of 6.2. ?   ?BPH (benign prostatic hyperplasia) ?Continue tamsulosin. ?   ?Lactic  acidosis ?Elevated on presentation.  Trended down.  Was on metformin as outpatient.  Creatinine was also elevated.  Procalcitonin 0.10 ? ?Hypokalemia ?Replenished.  Potassium of 3.8 at this time.  We will continue to monitor ? ?Advanced dementia   Continue memantine. ?  ? ? DVT prophylaxis:   Heparin drip ? ? ?Code Status:   ?  Code Status: DNR ? ?Disposition: Likely to SNF versus Home Health ? ?Status is: Inpatient ? ?Remains inpatient appropriate because: ST elevation MI on conservative treatment. ? ? Family Communication:  ? I spoke with the patient's daughter Ms Raquel Sarna on the phone and updated her about the clinical condition of the patient.  Overall prognosis seems to be guarded at this time. ? ?Consultants:  ?Cardiology ? ?Procedures:  ?None ? ?Antimicrobials:  ?None ? ?Anti-infectives (From admission, onward)  ? ? None  ? ?  ? ?Subjective: ?Today, patient was seen and examined at bedside.  Has advanced dementia.  Denies any pain or shortness of breath.  Poor historian.  Nursing staff reported episodes of bradycardia. ? ?Objective: ?Vitals:  ? 08/25/21 1145 08/25/21 1200 08/25/21 1215 08/25/21 1230  ?BP: 132/79 136/71 122/76 119/72  ?Pulse: 60 60 61 (!) 54  ?Resp: 18 (!) 21  16  ?Temp:      ?SpO2:  94%  94%  ?Weight:      ?Height:      ? ? ?Intake/Output Summary (Last 24 hours) at 08/25/2021 1243 ?Last data filed at 08/25/2021 0840 ?Gross per 24 hour  ?Intake 240.61 ml  ?  Output 400 ml  ?Net -159.39 ml  ? ?Filed Weights  ? 08/24/21 1908  ?Weight: 73 kg  ? ? ?Physical Examination: ? ?General:  Average built, not in obvious distress, elderly male, has advanced dementia ?HENT:   No scleral pallor or icterus noted. Oral mucosa is moist.  ?Chest:  Clear breath sounds.  Diminished breath sounds bilaterally. No crackles or wheezes.  ?CVS: S1 &S2 heard.  ?Abdomen: Soft, nontender, nondistended.  Bowel sounds are heard.   ?Extremities: No cyanosis, clubbing or edema.  Peripheral pulses are palpable.  On mittens. ?Psych:  Elderly male with advanced dementia ?CNS:  No cranial nerve deficits.  Moves all extremities. ?Skin: Warm and dry.  No rashes noted. ? ?Data Reviewed:  ? ?CBC: ?Recent Labs  ?Lab 08/24/21 ?1925 08/24/21 ?1942  ?WBC 10.5  --   ?NEUTROABS 7.2  --   ?HGB 15.5 16.0  ?HCT 46.1 47.0  ?MCV 89.2  --   ?PLT 304  --   ? ? ?Basic Metabolic Panel: ?Recent Labs  ?Lab 08/24/21 ?1925 08/24/21 ?1942 08/25/21 ?0410  ?NA 139 139 139  ?K 3.1* 3.0* 3.8  ?CL 103 103 108  ?CO2 21*  --  19*  ?GLUCOSE 130* 129* 115*  ?BUN 21 25* 21  ?CREATININE 1.42* 1.10 1.02  ?CALCIUM 9.4  --  9.2  ?MG  --   --  1.9  ? ? ?Liver Function Tests: ?Recent Labs  ?Lab 08/24/21 ?1925 08/25/21 ?0410  ?AST 100* 93*  ?ALT 23 24  ?ALKPHOS 58 64  ?BILITOT 0.8 1.2  ?PROT 6.3* 6.5  ?ALBUMIN 3.5 3.4*  ? ? ? ?Radiology Studies: ?CT Head Wo Contrast ? ?Result Date: 08/24/2021 ?CLINICAL DATA:  Mental status change, unknown cause EXAM: CT HEAD WITHOUT CONTRAST TECHNIQUE: Contiguous axial images were obtained from the base of the skull through the vertex without intravenous contrast. RADIATION DOSE REDUCTION: This exam was performed according to the departmental dose-optimization program which includes automated exposure control, adjustment of the mA and/or kV according to patient size and/or use of iterative reconstruction technique. COMPARISON:  CT head Oct 15, 2016.  MRI head November 06, 2019. FINDINGS: Brain: No evidence of acute infarction, acute hemorrhage, hydrocephalus, extra-axial collection or mass lesion/mass effect. Cerebral atrophy with mesial temporal predominance. Associated ex vacuo ventricular dilation. Mild patchy white matter hypoattenuation, nonspecific but compatible with chronic microvascular ischemic disease. Vascular: No hyperdense vessel identified. Skull: No acute orbital findings. Sinuses/Orbits: Small probable retention cyst in the right sphenoid sinus. Otherwise, clear sinuses. No acute orbital findings. Other: No mastoid effusions. IMPRESSION: 1. No  evidence of acute intracranial abnormality. 2. Cerebral atrophy with mesial temporal predominance, which is nonspecific but can be seen with Alzheimer's disease. Electronically Signed   By: Margaretha Sheffield M.D.   On: 08/24/2021 20:49  ? ?DG Chest Port 1 View ? ?Result Date: 08/24/2021 ?CLINICAL DATA:  Mental status change EXAM: PORTABLE CHEST 1 VIEW COMPARISON:  Radiograph 12/10/2011 FINDINGS: Unchanged cardiomediastinal silhouette. Left basilar opacities. Streaky right basilar opacities. No large pleural effusion. No visible pneumothorax. No acute osseous abnormality. IMPRESSION: Left basilar opacity and streaky right basilar opacities, which could be atelectasis or pneumonia. Electronically Signed   By: Maurine Simmering M.D.   On: 08/24/2021 20:03   ? ? ? LOS: 1 day  ? ? ?Flora Lipps, MD ?[facility] ?08/25/2021, 12:43 PM  ?  ?

## 2021-08-25 NOTE — Assessment & Plan Note (Signed)
Difficult scenario as this confuses the issue of how to properly manage.  By convention, the appropriate treatment for LV thrombus is warfarin based on lack of data from DOACs.  However I question the safety profile of warfarin in this elderly gentleman with advanced dementia and falls. ? ?This will require a prolonged discussion between family members the rounding cardiologist, hospitalist to determine the best course of action. ? ?I have proposed a couple course of action including: ?? Focal anticoagulation with warfarin which requires bridging along with Plavix without aspirin ?? Substituting DOAC such as Eliquis for warfarin which would therefore not require bridging, and then use Plavix. ?? For less aggressive option, would potentially use aspirin instead of Plavix. ?

## 2021-08-25 NOTE — Assessment & Plan Note (Addendum)
#)  Lactic acidosis: Initial lactate mildly elevated at 2.2, with repeat value trending down to 1.4.  Suspect contribution from relative tissue hypoperfusion in the setting of presenting STEMI, as above.  No evidence of underlying infectious process at this time, and criteria for sepsis not currently met.  Urinalysis also without any signs of infection.  No history of hepatic dysfunction, and presenting INR preserved at 1.1.  Outpatient metformin noted, however in the absence of elevated creatinine, type B lactic acidosis on the basis of this medication appears less likely, particularly given interval resolution of mild lactic acidosis without interval IV fluids. ?  ?Plan: Further evaluation and management of presenting STEMI, as above.  Repeat CMP/CBC in the morning.  Follow-up result urinalysis.  Add on procalcitonin level.  Monitor strict I's and O's and daily weights. ?  ?  ?

## 2021-08-25 NOTE — ED Notes (Addendum)
DNR and Fall Risk bracelet placed on pt.  ?

## 2021-08-25 NOTE — Assessment & Plan Note (Signed)
#)   Benign Prostatic Hyperplasia:  documented h/o such; on tamsulosin as outpatient.  ?  ?Plan: monitor strict I's & O's and daily weights. Repeat BMP in AM. continue home tamsulosin.  ?  ?   ?

## 2021-08-25 NOTE — Progress Notes (Signed)
?   08/25/21 1334  ?Assess: MEWS Score  ?Temp 97.8 ?F (36.6 ?C)  ?BP (!) 143/71  ?Pulse Rate (!) 56  ?ECG Heart Rate (!) 45  ?Resp (!) 22  ?Level of Consciousness Alert  ?SpO2 93 %  ?O2 Device Room Air  ?Assess: MEWS Score  ?MEWS Temp 0  ?MEWS Systolic 0  ?MEWS Pulse 1  ?MEWS RR 1  ?MEWS LOC 0  ?MEWS Score 2  ?MEWS Score Color Yellow  ?Assess: if the MEWS score is Yellow or Red  ?Were vital signs taken at a resting state? Yes  ?Focused Assessment No change from prior assessment  ?Early Detection of Sepsis Score *See Row Information* Low  ?MEWS guidelines implemented *See Row Information* Yes  ?Treat  ?MEWS Interventions Escalated (See documentation below)  ?Pain Scale Faces  ?Faces Pain Scale 0  ?Take Vital Signs  ?Increase Vital Sign Frequency  Yellow: Q 2hr X 2 then Q 4hr X 2, if remains yellow, continue Q 4hrs  ?Escalate  ?MEWS: Escalate Yellow: discuss with charge nurse/RN and consider discussing with provider and RRT  ?Notify: Charge Nurse/RN  ?Name of Charge Nurse/RN Notified Jamas Lav RN  ?Date Charge Nurse/RN Notified 08/25/21  ?Time Charge Nurse/RN Notified 1341  ?Notify: Provider  ?Provider Name/Title Sherlon Handing MD  ?Date Provider Notified 08/25/21  ?Time Provider Notified 1343  ?Notification Type Page  ?Notification Reason Other (Comment) ?(Yellow MEWS)  ?Provider response No new orders;Other (Comment) ?(hold metoprolol per MD)  ?Date of Provider Response 08/25/21  ?Time of Provider Response 1401  ?Document  ?Patient Outcome Other (Comment) ?(Patient stable- will hold Metoprolol)  ?Progress note created (see row info) Yes  ? ? ?

## 2021-08-25 NOTE — Subjective & Objective (Addendum)
? ?CARDIOLOGY ROUNDING PROGRESS NOTE ? ?Patient Name: Gregory Lamb ?Date of Encounter: 08/25/2021 ? ?[facility] Cardiologist: None  ? ?Patient Profile  ?   ?88 y.o. male with a history of diabetes mellitus, type II and potentially hypertension with advanced cognitive impairment/dementia and gait abnormality who presented with gait abnormality, falls and worsening disorientation.  He was found to have elevated troponins and EKG changes suggestive of anterior ST ovation MI of undetermined acuity.  He denied any symptoms, although he is relative for historian.  Based on family discussion, decision was made to treat medically with no plans for invasive evaluation. ? ?Principal Problem: ?  ST elevation myocardial infarction (STEMI) of anterior wall ([place]) ?Active Problems: ?  Coronary artery disease involving native coronary artery of native heart with angina pectoris ([place]) ?  Left ventricular thrombus following MI [facility]) ?  Memory disorder ?  Hypokalemia ?  Lactic acidosis ?  BPH (benign prostatic hyperplasia) ?  DM2 (diabetes mellitus, type 2) ([place]) ?  Advanced dementia ?  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 ? ?Subjective  ? ?"I'm OK" --> relatively nonverbal. ?Patient seems in no distress.  Family does not indicate that he is acting unusual. ? ?Vital Signs  ?  ?Vitals:  ? 08/25/21 1523 08/25/21 1735 08/25/21 1950 08/25/21 2122  ?BP: (!) 170/83 (!) 158/82  (!) 144/85  ?Pulse: (!) 57   82  ?Resp: 20 (!) '21 18 19  '$ ?Temp: 98.6 ?F (37 ?C)   98.5 ?F (36.9 ?C)  ?TempSrc: Axillary   Oral  ?SpO2: 93% 95%  95%  ?Weight:      ?Height:      ? ? ?Intake/Output Summary (Last 24 hours) at 08/25/2021 2337 ?Last data filed at 08/25/2021 1734 ?Gross per 24 hour  ?Intake 316.37 ml  ?Output 400 ml  ?Net -83.63 ml  ? ? ?  08/24/2021  ?  7:08 PM 10/27/2019  ?  8:11 AM 09/25/2017  ?  9:08 AM  ?Last 3 Weights  ?Weight (lbs) 160 lb 15 oz 161 lb 171 lb  ?Weight (kg) 73 kg 73.029 kg 77.565 kg  ?   ? ?Physical Exam   ? ?General appearance: alert, cooperative, no distress, slowed mentation, and minimally responsive, although he seems to try. ?Neck: no carotid bruit, no JVD, and supple, symmetrical, trachea midline ?Lungs: clear to auscultation bilaterally, normal percussion bilaterally, and nonlabored, good air movement ?Heart: regular rate and rhythm, S1, S2 normal, no murmur, click, rub or gallop, normal apical impulse, and occasional ectopy ?Abdomen: soft, non-tender; bowel sounds normal; no masses,  no organomegaly ?Extremities: extremities normal, atraumatic, no cyanosis or edema and edema trivial ?Pulses: 2+ and symmetric ?Trivial ankle ?Neurologic: Mental status: Alert, oriented, thought content appropriate, alertness: alert, orientation: Responds to name, affect: flat, minimally responsive. ? ?Telemetry  ?  ?Sinus rhythm PVCs- Personally Reviewed ? ?ECG  ?  ?Sinus rhythm 82 bpm.  PVCs.  Prolonged PR normal.  Old inferior infarct, evolutionary changes of acute anterior infarct-recent. Personally Reviewed ? ? ?Labs  ?  ?High Sensitivity Troponin:   ?Recent Labs  ?Lab 08/24/21 ?1925 08/24/21 ?2111 08/25/21 ?9371  ?TROPONINIHS [id number]* [id number]* 69,678*  ?   ?Chemistry ?Recent Labs  ?Lab 08/24/21 ?1925 08/24/21 ?1942 08/25/21 ?0410  ?NA 139 139 139  ?K 3.1* 3.0* 3.8  ?CL 103 103 108  ?CO2 21*  --  19*  ?GLUCOSE 130* 129* 115*  ?BUN 21 25* 21  ?CREATININE 1.42* 1.10 1.02  ?CALCIUM 9.4  --  9.2  ?MG  --   --  1.9  ?PROT 6.3*  --  6.5  ?ALBUMIN 3.5  --  3.4*  ?AST 100*  --  93*  ?ALT 23  --  24  ?ALKPHOS 58  --  64  ?BILITOT 0.8  --  1.2  ?GFRNONAA 48*  --  >60  ?ANIONGAP 15  --  12  ?  ?Lipids  ?Recent Labs  ?Lab 08/25/21 ?2952  ?CHOL 167  ?TRIG 59  ?HDL 40*  ?LDLCALC 115*  ?CHOLHDL 4.2  ?  ?Hematology ?Recent Labs  ?Lab 08/24/21 ?1925 08/24/21 ?1942  ?WBC 10.5  --   ?RBC 5.17  --   ?HGB 15.5 16.0  ?HCT 46.1 47.0  ?MCV 89.2  --   ?MCH 30.0  --   ?MCHC 33.6  --   ?RDW 12.4  --   ?PLT 304  --   ? ?Thyroid  ?Recent Labs  ?Lab  08/25/21 ?1340  ?TSH 0.473  ?  ?BNP ?Recent Labs  ?Lab 08/24/21 ?1925  ?BNP 121.7*  ?  ?DDimer No results for input(s): DDIMER in the last 168 hours.  ? ?Radiology  ?  ?CT Head Wo Contrast ? ?Result Date: 08/24/2021 ?CLINICAL DATA:  Mental status change, unknown cause EXAM: CT HEAD WITHOUT CONTRAST TECHNIQUE: Contiguous axial images were obtained from the base of the skull through the vertex without intravenous contrast. RADIATION DOSE REDUCTION: This exam was performed according to the departmental dose-optimization program which includes automated exposure control, adjustment of the mA and/or kV according to patient size and/or use of iterative reconstruction technique. COMPARISON:  CT head Oct 15, 2016.  MRI head November 06, 2019. FINDINGS: Brain: No evidence of acute infarction, acute hemorrhage, hydrocephalus, extra-axial collection or mass lesion/mass effect. Cerebral atrophy with mesial temporal predominance. Associated ex vacuo ventricular dilation. Mild patchy white matter hypoattenuation, nonspecific but compatible with chronic microvascular ischemic disease. Vascular: No hyperdense vessel identified. Skull: No acute orbital findings. Sinuses/Orbits: Small probable retention cyst in the right sphenoid sinus. Otherwise, clear sinuses. No acute orbital findings. Other: No mastoid effusions. IMPRESSION: 1. No evidence of acute intracranial abnormality. 2. Cerebral atrophy with mesial temporal predominance, which is nonspecific but can be seen with Alzheimer's disease. Electronically Signed   By: Margaretha Sheffield M.D.   On: 08/24/2021 20:49  ? ?DG Chest Port 1 View ? ?Result Date: 08/24/2021 ?CLINICAL DATA:  Mental status change EXAM: PORTABLE CHEST 1 VIEW COMPARISON:  Radiograph 12/10/2011 FINDINGS: Unchanged cardiomediastinal silhouette. Left basilar opacities. Streaky right basilar opacities. No large pleural effusion. No visible pneumothorax. No acute osseous abnormality. IMPRESSION: Left basilar opacity and  streaky right basilar opacities, which could be atelectasis or pneumonia. Electronically Signed   By: Maurine Simmering M.D.   On: 08/24/2021 20:03  ? ?ECHOCARDIOGRAM COMPLETE ? ?Result Date: 08/25/2021 ?   ECHOCARDIOGRAM REPORT   Patient Name:   Gregory Lamb Date of Exam: 08/25/2021 Medical Rec #:  [id number]        Height:       68.0 in Accession #:    [id number]       Weight:       160.9 lb Date of Birth:  May 12, 1934        BSA:          1.863 m? Patient Age:    79 years         BP:           159/82 mmHg Patient Gender: M  HR:           71 bpm. Exam Location:  Inpatient Procedure: 2D Echo and Intracardiac Opacification Agent Indications:    NSTEMI  History:        Patient has no prior history of Echocardiogram examinations.                 Risk Factors:Diabetes.  Sonographer:    Johny Chess RDCS Referring Phys: [id number] [employer]  1. Left ventricular ejection fraction, by estimation, is 40 to 45%. The left ventricle has mildly decreased function. The left ventricle demonstrates regional wall motion abnormalities (see scoring diagram/findings for description). Left ventricular diastolic parameters were normal.  2. Right ventricular systolic function is normal. The right ventricular size is normal.  3. The mitral valve is grossly normal. Trivial mitral valve regurgitation. No evidence of mitral stenosis.  4. The aortic valve was not well visualized. There is mild calcification of the aortic valve. There is mild thickening of the aortic valve. Aortic valve regurgitation is not visualized. No aortic stenosis is present.  5. The inferior vena cava is normal in size with greater than 50% respiratory variability, suggesting right atrial pressure of 3 mmHg. Comparison(s): No prior Echocardiogram. Conclusion(s)/Recommendation(s): Focal wall motion abnormalities in the mid to apical septal walls and peri-apical portions of the anterior and lateral walls. There is a small adherent LV  thrombus seen (see image 80) at the apical anteroseptum. Findings communicated with Dr. Louanne Belton and the cardiology rounding team. FINDINGS  Left Ventricle: Left ventricular ejection fraction, by estimation, is 40 to

## 2021-08-25 NOTE — Progress Notes (Signed)
?  Echocardiogram ?2D Echocardiogram has been performed. ? ?Gregory Lamb ?08/25/2021, 10:58 AM ?

## 2021-08-25 NOTE — Assessment & Plan Note (Signed)
Has never been previously diagnosed, but now with presentation of ACS/STEMI and LAD distribution wall motion abnormality, but imagine that there is only some LAD disease. ? ?Would recommend 72 hours anticoagulation (could use enoxaparin over IV heparin if necessary) ?=> For now aspirin and Plavix, and likely discontinue aspirin once an oral anticoagulant started. ?? Agree with ARB.  Titrate dose as blood pressure tolerates, could also add low-dose dihydropyridine calcium channel blocker if BP tolerates. ?? He is relatively bradycardic and therefore would not titrate up beta-blocker. ?? Correction benefit of statin, expressive is concerned about memory issues.  Would potentially start low-dose rosuvastatin. ?

## 2021-08-25 NOTE — Assessment & Plan Note (Addendum)
EKG suggesting anterior ST elevations, troponin elevation not very significant. ?Echocardiogram does suggest wall motion abnormality consistent with LAD infarct. ? ?There is also concern for LV thrombus. ? ?At this point, medical management is the best course of action.  No plans for invasive evaluation given his advanced dementia. ?? Would recommend 72 hours of IV heparin (or appropriately convert to treatment dose enoxaparin to avoid IV medications) simply for the STEMI/ACS--at that point we need to determine what to do as far as anticoagulation for the left ventricular thrombus. ?? Would recommend aspirin and Plavix while not on full anticoagulation for LV thrombus.  Once converted to oral anticoagulation for LV thrombus, would DC aspirin. ?? Clearly no beta-blocker as he is bradycardic. ?? Low-dose ARB added, with blood pressures being elevated, probably have room to titrate further as BP tolerates. ? ? ?

## 2021-08-25 NOTE — Progress Notes (Signed)
Subjective/Objective ? ? ?CARDIOLOGY ROUNDING PROGRESS NOTE ? ?Patient Name: Gregory Lamb ?Date of Encounter: 08/25/2021 ? ?[facility] Cardiologist: None  ? ?Patient Profile  ?   ?88 y.o. male with a history of diabetes mellitus, type II and potentially hypertension with advanced cognitive impairment/dementia and gait abnormality who presented with gait abnormality, falls and worsening disorientation.  He was found to have elevated troponins and EKG changes suggestive of anterior ST ovation MI of undetermined acuity.  He denied any symptoms, although he is relative for historian.  Based on family discussion, decision was made to treat medically with no plans for invasive evaluation. ? ?Principal Problem: ?  ST elevation myocardial infarction (STEMI) of anterior wall ([place]) ?Active Problems: ?  Coronary artery disease involving native coronary artery of native heart with angina pectoris ([place]) ?  Left ventricular thrombus following MI [facility]) ?  Memory disorder ?  Hypokalemia ?  Lactic acidosis ?  BPH (benign prostatic hyperplasia) ?  DM2 (diabetes mellitus, type 2) ([place]) ?  Advanced dementia ?  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 ? ?Subjective  ? ?"I'm OK" --> relatively nonverbal. ?Patient seems in no distress.  Family does not indicate that he is acting unusual. ? ?Vital Signs  ?  ?Vitals:  ? 08/25/21 1523 08/25/21 1735 08/25/21 1950 08/25/21 2122  ?BP: (!) 170/83 (!) 158/82  (!) 144/85  ?Pulse: (!) 57   82  ?Resp: 20 (!) '21 18 19  '$ ?Temp: 98.6 ?F (37 ?C)   98.5 ?F (36.9 ?C)  ?TempSrc: Axillary   Oral  ?SpO2: 93% 95%  95%  ?Weight:      ?Height:      ? ? ?Intake/Output Summary (Last 24 hours) at 08/25/2021 2337 ?Last data filed at 08/25/2021 1734 ?Gross per 24 hour  ?Intake 316.37 ml  ?Output 400 ml  ?Net -83.63 ml  ? ? ?  08/24/2021  ?  7:08 PM 10/27/2019  ?  8:11 AM 09/25/2017  ?  9:08 AM  ?Last 3 Weights  ?Weight (lbs) 160 lb 15 oz 161 lb 171 lb  ?Weight (kg) 73 kg 73.029 kg 77.565 kg   ?   ? ?Physical Exam  ? ?General appearance: alert, cooperative, no distress, slowed mentation, and minimally responsive, although he seems to try. ?Neck: no carotid bruit, no JVD, and supple, symmetrical, trachea midline ?Lungs: clear to auscultation bilaterally, normal percussion bilaterally, and nonlabored, good air movement ?Heart: regular rate and rhythm, S1, S2 normal, no murmur, click, rub or gallop, normal apical impulse, and occasional ectopy ?Abdomen: soft, non-tender; bowel sounds normal; no masses,  no organomegaly ?Extremities: extremities normal, atraumatic, no cyanosis or edema and edema trivial ?Pulses: 2+ and symmetric ?Trivial ankle ?Neurologic: Mental status: Alert, oriented, thought content appropriate, alertness: alert, orientation: Responds to name, affect: flat, minimally responsive. ? ?Telemetry  ?  ?Sinus rhythm PVCs- Personally Reviewed ? ?ECG  ?  ?Sinus rhythm 82 bpm.  PVCs.  Prolonged PR normal.  Old inferior infarct, evolutionary changes of acute anterior infarct-recent. Personally Reviewed ? ? ?Labs  ?  ?High Sensitivity Troponin:   ?Recent Labs  ?Lab 08/24/21 ?1925 08/24/21 ?2111 08/25/21 ?4562  ?TROPONINIHS [id number]* [id number]* 56,389*  ?   ?Chemistry ?Recent Labs  ?Lab 08/24/21 ?1925 08/24/21 ?1942 08/25/21 ?0410  ?NA 139 139 139  ?K 3.1* 3.0* 3.8  ?CL 103 103 108  ?CO2 21*  --  19*  ?GLUCOSE 130* 129* 115*  ?BUN 21 25* 21  ?CREATININE 1.42* 1.10 1.02  ?  CALCIUM 9.4  --  9.2  ?MG  --   --  1.9  ?PROT 6.3*  --  6.5  ?ALBUMIN 3.5  --  3.4*  ?AST 100*  --  93*  ?ALT 23  --  24  ?ALKPHOS 58  --  64  ?BILITOT 0.8  --  1.2  ?GFRNONAA 48*  --  >60  ?ANIONGAP 15  --  12  ?  ?Lipids  ?Recent Labs  ?Lab 08/25/21 ?7209  ?CHOL 167  ?TRIG 59  ?HDL 40*  ?LDLCALC 115*  ?CHOLHDL 4.2  ?  ?Hematology ?Recent Labs  ?Lab 08/24/21 ?1925 08/24/21 ?1942  ?WBC 10.5  --   ?RBC 5.17  --   ?HGB 15.5 16.0  ?HCT 46.1 47.0  ?MCV 89.2  --   ?MCH 30.0  --   ?MCHC 33.6  --   ?RDW 12.4  --   ?PLT 304  --   ? ?Thyroid   ?Recent Labs  ?Lab 08/25/21 ?1340  ?TSH 0.473  ?  ?BNP ?Recent Labs  ?Lab 08/24/21 ?1925  ?BNP 121.7*  ?  ?DDimer No results for input(s): DDIMER in the last 168 hours.  ? ?Radiology  ?  ?CT Head Wo Contrast ? ?Result Date: 08/24/2021 ?CLINICAL DATA:  Mental status change, unknown cause EXAM: CT HEAD WITHOUT CONTRAST TECHNIQUE: Contiguous axial images were obtained from the base of the skull through the vertex without intravenous contrast. RADIATION DOSE REDUCTION: This exam was performed according to the departmental dose-optimization program which includes automated exposure control, adjustment of the mA and/or kV according to patient size and/or use of iterative reconstruction technique. COMPARISON:  CT head Oct 15, 2016.  MRI head November 06, 2019. FINDINGS: Brain: No evidence of acute infarction, acute hemorrhage, hydrocephalus, extra-axial collection or mass lesion/mass effect. Cerebral atrophy with mesial temporal predominance. Associated ex vacuo ventricular dilation. Mild patchy white matter hypoattenuation, nonspecific but compatible with chronic microvascular ischemic disease. Vascular: No hyperdense vessel identified. Skull: No acute orbital findings. Sinuses/Orbits: Small probable retention cyst in the right sphenoid sinus. Otherwise, clear sinuses. No acute orbital findings. Other: No mastoid effusions. IMPRESSION: 1. No evidence of acute intracranial abnormality. 2. Cerebral atrophy with mesial temporal predominance, which is nonspecific but can be seen with Alzheimer's disease. Electronically Signed   By: Margaretha Sheffield M.D.   On: 08/24/2021 20:49  ? ?DG Chest Port 1 View ? ?Result Date: 08/24/2021 ?CLINICAL DATA:  Mental status change EXAM: PORTABLE CHEST 1 VIEW COMPARISON:  Radiograph 12/10/2011 FINDINGS: Unchanged cardiomediastinal silhouette. Left basilar opacities. Streaky right basilar opacities. No large pleural effusion. No visible pneumothorax. No acute osseous abnormality. IMPRESSION: Left  basilar opacity and streaky right basilar opacities, which could be atelectasis or pneumonia. Electronically Signed   By: Maurine Simmering M.D.   On: 08/24/2021 20:03  ? ?ECHOCARDIOGRAM COMPLETE ? ?Result Date: 08/25/2021 ?   ECHOCARDIOGRAM REPORT   Patient Name:   TRYGVE THAL Date of Exam: 08/25/2021 Medical Rec #:  [id number]        Height:       68.0 in Accession #:    [id number]       Weight:       160.9 lb Date of Birth:  28-Jul-1933        BSA:          1.863 m? Patient Age:    28 years         BP:           159/82 mmHg  Patient Gender: M                HR:           71 bpm. Exam Location:  Inpatient Procedure: 2D Echo and Intracardiac Opacification Agent Indications:    NSTEMI  History:        Patient has no prior history of Echocardiogram examinations.                 Risk Factors:Diabetes.  Sonographer:    Johny Chess RDCS Referring Phys: [id number] [place]  1. Left ventricular ejection fraction, by estimation, is 40 to 45%. The left ventricle has mildly decreased function. The left ventricle demonstrates regional wall motion abnormalities (see scoring diagram/findings for description). Left ventricular diastolic parameters were normal.  2. Right ventricular systolic function is normal. The right ventricular size is normal.  3. The mitral valve is grossly normal. Trivial mitral valve regurgitation. No evidence of mitral stenosis.  4. The aortic valve was not well visualized. There is mild calcification of the aortic valve. There is mild thickening of the aortic valve. Aortic valve regurgitation is not visualized. No aortic stenosis is present.  5. The inferior vena cava is normal in size with greater than 50% respiratory variability, suggesting right atrial pressure of 3 mmHg. Comparison(s): No prior Echocardiogram. Conclusion(s)/Recommendation(s): Focal wall motion abnormalities in the mid to apical septal walls and peri-apical portions of the anterior and lateral walls. There is a small  adherent LV thrombus seen (see image 80) at the apical anteroseptum. Findings communicated with Dr. Louanne Belton and the cardiology rounding team. FINDINGS  Left Ventricle: Left ventricular ejection fraction, b

## 2021-08-25 NOTE — Assessment & Plan Note (Signed)
Poor candidate for invasive/interventional procedures.  Recommend medical management. ?

## 2021-08-25 NOTE — Assessment & Plan Note (Deleted)
In thePoor candidate for the options.  Medical management. ?

## 2021-08-25 NOTE — Progress Notes (Signed)
ANTICOAGULATION CONSULT NOTE  ? ?Pharmacy Consult for heparin ?Indication: chest pain/ACS ? ?No Known Allergies ? ?Patient Measurements: ?Height: '5\' 8"'$  (172.7 cm) ?Weight: 73 kg (160 lb 15 oz) ?IBW/kg (Calculated) : 68.4 ?Heparin Dosing Weight: 73 kg ? ?Vital Signs: ?Temp: 98.3 ?F (36.8 ?C) (03/23 1908) ?BP: 167/98 (03/24 0400) ?Pulse Rate: 68 (03/24 0400) ? ?Labs: ?Recent Labs  ?  08/24/21 ?1925 08/24/21 ?1942 08/24/21 ?1955 08/24/21 ?2111 08/25/21 ?0410  ?HGB 15.5 16.0  --   --   --   ?HCT 46.1 47.0  --   --   --   ?PLT 304  --   --   --   --   ?LABPROT  --   --  13.9  --   --   ?INR  --   --  1.1  --   --   ?HEPARINUNFRC  --   --   --   --  0.57  ?CREATININE 1.42* 1.10  --   --   --   ?TROPONINIHS 12,524*  --   --  16,651*  --   ? ? ? ?Estimated Creatinine Clearance: 46.6 mL/min (by C-G formula based on SCr of 1.1 mg/dL). ? ? ?Medical History: ?Past Medical History:  ?Diagnosis Date  ? BPH (benign prostatic hypertrophy)   ? Cataract   ? bil catarCTS REMOVED  ? Colon polyps   ? Diabetes mellitus   ? Gait abnormality 10/27/2019  ? Hernia   ? abdominal/ventral  ? Memory disorder 10/27/2019  ? Memory loss   ? Sleep apnea   ? last study 5 years ago- per patient SEVERE.  wears c_pap  ? ? ?Medications:  ?(Not in a hospital admission) ?Scheduled:  ? aspirin EC  81 mg Oral Daily  ? donepezil  10 mg Oral Daily  ? insulin aspart  0-6 Units Subcutaneous TID WC  ? losartan  25 mg Oral Daily  ? memantine  5 mg Oral BID  ? metoprolol tartrate  12.5 mg Oral BID  ? tamsulosin  0.4 mg Oral QPC supper  ? ?Infusions:  ? heparin 900 Units/hr (08/24/21 2105)  ? ? ?Assessment: ?Patient presented after multiple fall. Pharmacy consulted to start heparin. Troponin is 12,524, lactic acid is 2.2, vitals and CBC are stable. F/u plans for anticoagulation during ACS work-up. ? ?Patient not taking anticoagulation PTA. ? ?3/24 AM update:  ?Heparin level therapeutic  ? ?Goal of Therapy:  ?Heparin level 0.3-0.7 units/ml ?Monitor platelets by  anticoagulation protocol: Yes ?  ?Plan:  ?Cont heparin 900 units/hr ?1400 heparin level ? ?Narda Bonds, PharmD, BCPS ?Clinical Pharmacist ?Phone: [phone] ? ? ? ?

## 2021-08-25 NOTE — Assessment & Plan Note (Addendum)
#)   Type 2 Diabetes Mellitus: documented history of such. Home insulin regimen: None. Home oral hypoglycemic agents: Metformin. presenting blood sugar: 130.   ?  ?Plan: accuchecks QAC and HS with low dose SSI. hold home oral hypoglycemic agents during this hospitalization.  A1c 6.2. ?  ?  ?

## 2021-08-25 NOTE — Assessment & Plan Note (Signed)
EKG suggesting anterior ST elevations, troponin elevation not very significant. ?Echocardiogram does suggest wall motion abnormality consistent with LAD infarct. ? ?There is also concern for LV thrombus. ? ?At this point, medical management is the best course of action.  No plans for invasive evaluation given his advanced dementia. ?? Would recommend 72 hours of IV heparin (or appropriately convert to treatment dose enoxaparin to avoid IV medications) simply for the STEMI/ACS--at that point we need to determine what to do as far as anticoagulation for the left ventricular thrombus. ?? Would recommend aspirin and Plavix while not on full anticoagulation for LV thrombus.  Once converted to oral anticoagulation for LV thrombus, would DC aspirin. ?? Low dose beta-blocker - cannot titrate as he is bradycardic. ?? Low-dose ARB added, with blood pressures being elevated, probably have room to titrate further as BP tolerates. ? ? ?

## 2021-08-25 NOTE — Assessment & Plan Note (Signed)
EF 40 to 45% after what appeared to be an anterior STEMI-being treated medically.  No PND, orthopnea or edema. ? ?Currently has losartan 25 mg added-can titrate up further based on blood pressures. => Would probably not try to titrate Entresto at this point. ?No beta-blocker because of bradycardia ?Relatively euvolemic.  At this present time no need for diuretic. ? ?

## 2021-08-25 NOTE — ED Notes (Signed)
Posey alarm placed, pt continuously attempting to get out of bed. Mittens in place due to pt pulling at lines and removing self from monitor.   ?

## 2021-08-25 NOTE — Assessment & Plan Note (Addendum)
Continue aricept, memantine. ?

## 2021-08-25 NOTE — ED Notes (Signed)
Pt's breakfast tray set up. Pt assisted with breakfast.  ?

## 2021-08-25 NOTE — Assessment & Plan Note (Signed)
#)   STEMI: Diagnosis on the basis of significantly elevated troponin, initially 12,500, with repeat value trending up to 16,600, with presenting EKG demonstrating evidence of ST elevation in anterior lateral leads, which appears new relative to most recent prior EKG, as further detailed above.  Within the limitations of the patient's advanced dementia, he denies any acute symptoms, including any recent chest pain.  It appears hemodynamically stable, without any evidence of acute respiratory distress.  No evidence of acute acutely decompensated heart failure at this time.  He is known CAD risk factors include type 2 diabetes mellitus as well as age.  ?  ?As described above, patient's daughter Henrietta Dine) has confirmed DNR/DNI status, and confirmed that patient would not want any invasive cardiology diagnostic procedures, including no cardiac cath, nor any reperfusion therapy, including no PCI with stents nor CABG, even if these measures were necessary for maintenance of life.  However, daughter conveys that the patient is amenable to medical management.  Cardiology has been formally consulted, as above, with preliminary recommendations as further discussed above, including initiation of heparin drip, aspirin, low-dose beta-blocker, risk stratification, and echocardiogram in the morning. ?  ?In the ED this evening, patient has received a full dose aspirin as well as initiation of heparin drip. ?  ?  ?Plan: Continue heparin drip.  Continue daily baby aspirin.  Metoprolol tartrate 12.5 mg p.o. twice daily.  Continue home losartan.  Repeat troponin level in the morning.  Echocardiogram ordered for the morning.  Monitor on telemetry.  Add on serum magnesium level.  Further evaluation and management of presenting hypokalemia, as further detailed below.  Check hemoglobin A1c, TSH, lipid panel, per cardiology consultation. ?  ?  ?  ?

## 2021-08-25 NOTE — ED Notes (Signed)
Pt's step daughter at bedside.  ?

## 2021-08-25 NOTE — ED Notes (Signed)
Echo at bedside

## 2021-08-25 NOTE — Progress Notes (Deleted)
Subjective/Objective ? ? ?CARDIOLOGY ROUNDING PROGRESS NOTE ? ?Patient Name: Gregory Lamb ?Date of Encounter: 08/25/2021 ? ?[facility] Cardiologist: None  ? ?Patient Profile  ?   ?88 y.o. male with a history of diabetes mellitus, type II and potentially hypertension with advanced cognitive impairment/dementia and gait abnormality who presented with gait abnormality, falls and worsening disorientation.  He was found to have elevated troponins and EKG changes suggestive of anterior ST ovation MI of undetermined acuity.  He denied any symptoms, although he is relative for historian.  Based on family discussion, decision was made to treat medically with no plans for invasive evaluation. ? ?Principal Problem: ?  ST elevation myocardial infarction (STEMI) of anterior wall ([place]) ?Active Problems: ?  Coronary artery disease involving native coronary artery of native heart with angina pectoris ([place]) ?  Left ventricular thrombus following MI [facility]) ?  Memory disorder ?  Hypokalemia ?  Lactic acidosis ?  BPH (benign prostatic hyperplasia) ?  DM2 (diabetes mellitus, type 2) ([place]) ?  Advanced dementia ?  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 ? ?Subjective  ? ?"I'm OK" --> relatively nonverbal. ?Patient seems in no distress.  Family does not indicate that he is acting unusual. ? ?Vital Signs  ?  ?Vitals:  ? 08/25/21 1523 08/25/21 1735 08/25/21 1950 08/25/21 2122  ?BP: (!) 170/83 (!) 158/82  (!) 144/85  ?Pulse: (!) 57   82  ?Resp: 20 (!) '21 18 19  '$ ?Temp: 98.6 ?F (37 ?C)   98.5 ?F (36.9 ?C)  ?TempSrc: Axillary   Oral  ?SpO2: 93% 95%  95%  ?Weight:      ?Height:      ? ? ?Intake/Output Summary (Last 24 hours) at 08/25/2021 2337 ?Last data filed at 08/25/2021 1734 ?Gross per 24 hour  ?Intake 316.37 ml  ?Output 400 ml  ?Net -83.63 ml  ? ? ?  08/24/2021  ?  7:08 PM 10/27/2019  ?  8:11 AM 09/25/2017  ?  9:08 AM  ?Last 3 Weights  ?Weight (lbs) 160 lb 15 oz 161 lb 171 lb  ?Weight (kg) 73 kg 73.029 kg 77.565 kg   ?   ? ?Physical Exam  ? ?General appearance: alert, cooperative, no distress, slowed mentation, and minimally responsive, although he seems to try. ?Neck: no carotid bruit, no JVD, and supple, symmetrical, trachea midline ?Lungs: clear to auscultation bilaterally, normal percussion bilaterally, and nonlabored, good air movement ?Heart: regular rate and rhythm, S1, S2 normal, no murmur, click, rub or gallop, normal apical impulse, and occasional ectopy ?Abdomen: soft, non-tender; bowel sounds normal; no masses,  no organomegaly ?Extremities: extremities normal, atraumatic, no cyanosis or edema and edema trivial ?Pulses: 2+ and symmetric ?Trivial ankle ?Neurologic: Mental status: Alert, oriented, thought content appropriate, alertness: alert, orientation: Responds to name, affect: flat, minimally responsive. ? ?Telemetry  ?  ?Sinus rhythm PVCs- Personally Reviewed ? ?ECG  ?  ?Sinus rhythm 82 bpm.  PVCs.  Prolonged PR normal.  Old inferior infarct, evolutionary changes of acute anterior infarct-recent. Personally Reviewed ? ? ?Labs  ?  ?High Sensitivity Troponin:   ?Recent Labs  ?Lab 08/24/21 ?1925 08/24/21 ?2111 08/25/21 ?0355  ?TROPONINIHS [id number]* [id number]* 97,416*  ?   ?Chemistry ?Recent Labs  ?Lab 08/24/21 ?1925 08/24/21 ?1942 08/25/21 ?0410  ?NA 139 139 139  ?K 3.1* 3.0* 3.8  ?CL 103 103 108  ?CO2 21*  --  19*  ?GLUCOSE 130* 129* 115*  ?BUN 21 25* 21  ?CREATININE 1.42* 1.10 1.02  ?  CALCIUM 9.4  --  9.2  ?MG  --   --  1.9  ?PROT 6.3*  --  6.5  ?ALBUMIN 3.5  --  3.4*  ?AST 100*  --  93*  ?ALT 23  --  24  ?ALKPHOS 58  --  64  ?BILITOT 0.8  --  1.2  ?GFRNONAA 48*  --  >60  ?ANIONGAP 15  --  12  ?  ?Lipids  ?Recent Labs  ?Lab 08/25/21 ?5643  ?CHOL 167  ?TRIG 59  ?HDL 40*  ?LDLCALC 115*  ?CHOLHDL 4.2  ?  ?Hematology ?Recent Labs  ?Lab 08/24/21 ?1925 08/24/21 ?1942  ?WBC 10.5  --   ?RBC 5.17  --   ?HGB 15.5 16.0  ?HCT 46.1 47.0  ?MCV 89.2  --   ?MCH 30.0  --   ?MCHC 33.6  --   ?RDW 12.4  --   ?PLT 304  --   ? ?Thyroid   ?Recent Labs  ?Lab 08/25/21 ?1340  ?TSH 0.473  ?  ?BNP ?Recent Labs  ?Lab 08/24/21 ?1925  ?BNP 121.7*  ?  ?DDimer No results for input(s): DDIMER in the last 168 hours.  ? ?Radiology  ?  ?CT Head Wo Contrast ? ?Result Date: 08/24/2021 ?CLINICAL DATA:  Mental status change, unknown cause EXAM: CT HEAD WITHOUT CONTRAST TECHNIQUE: Contiguous axial images were obtained from the base of the skull through the vertex without intravenous contrast. RADIATION DOSE REDUCTION: This exam was performed according to the departmental dose-optimization program which includes automated exposure control, adjustment of the mA and/or kV according to patient size and/or use of iterative reconstruction technique. COMPARISON:  CT head Oct 15, 2016.  MRI head November 06, 2019. FINDINGS: Brain: No evidence of acute infarction, acute hemorrhage, hydrocephalus, extra-axial collection or mass lesion/mass effect. Cerebral atrophy with mesial temporal predominance. Associated ex vacuo ventricular dilation. Mild patchy white matter hypoattenuation, nonspecific but compatible with chronic microvascular ischemic disease. Vascular: No hyperdense vessel identified. Skull: No acute orbital findings. Sinuses/Orbits: Small probable retention cyst in the right sphenoid sinus. Otherwise, clear sinuses. No acute orbital findings. Other: No mastoid effusions. IMPRESSION: 1. No evidence of acute intracranial abnormality. 2. Cerebral atrophy with mesial temporal predominance, which is nonspecific but can be seen with Alzheimer's disease. Electronically Signed   By: Margaretha Sheffield M.D.   On: 08/24/2021 20:49  ? ?DG Chest Port 1 View ? ?Result Date: 08/24/2021 ?CLINICAL DATA:  Mental status change EXAM: PORTABLE CHEST 1 VIEW COMPARISON:  Radiograph 12/10/2011 FINDINGS: Unchanged cardiomediastinal silhouette. Left basilar opacities. Streaky right basilar opacities. No large pleural effusion. No visible pneumothorax. No acute osseous abnormality. IMPRESSION: Left  basilar opacity and streaky right basilar opacities, which could be atelectasis or pneumonia. Electronically Signed   By: Maurine Simmering M.D.   On: 08/24/2021 20:03  ? ?ECHOCARDIOGRAM COMPLETE ? ?Result Date: 08/25/2021 ?   ECHOCARDIOGRAM REPORT   Patient Name:   Gregory Lamb Date of Exam: 08/25/2021 Medical Rec #:  [id number]        Height:       68.0 in Accession #:    [id number]       Weight:       160.9 lb Date of Birth:  August 23, 1933        BSA:          1.863 m? Patient Age:    18 years         BP:           159/82 mmHg  Patient Gender: M                HR:           71 bpm. Exam Location:  Inpatient Procedure: 2D Echo and Intracardiac Opacification Agent Indications:    NSTEMI  History:        Patient has no prior history of Echocardiogram examinations.                 Risk Factors:Diabetes.  Sonographer:    Johny Chess RDCS Referring Phys: [id number] [place]  1. Left ventricular ejection fraction, by estimation, is 40 to 45%. The left ventricle has mildly decreased function. The left ventricle demonstrates regional wall motion abnormalities (see scoring diagram/findings for description). Left ventricular diastolic parameters were normal.  2. Right ventricular systolic function is normal. The right ventricular size is normal.  3. The mitral valve is grossly normal. Trivial mitral valve regurgitation. No evidence of mitral stenosis.  4. The aortic valve was not well visualized. There is mild calcification of the aortic valve. There is mild thickening of the aortic valve. Aortic valve regurgitation is not visualized. No aortic stenosis is present.  5. The inferior vena cava is normal in size with greater than 50% respiratory variability, suggesting right atrial pressure of 3 mmHg. Comparison(s): No prior Echocardiogram. Conclusion(s)/Recommendation(s): Focal wall motion abnormalities in the mid to apical septal walls and peri-apical portions of the anterior and lateral walls. There is a small  adherent LV thrombus seen (see image 80) at the apical anteroseptum. Findings communicated with Dr. Louanne Belton and the cardiology rounding team. FINDINGS  Left Ventricle: Left ventricular ejection fraction, b

## 2021-08-25 NOTE — Assessment & Plan Note (Addendum)
Has never been previously diagnosed, but now with presentation of ACS/STEMI and LAD distribution wall motion abnormality, but imagine that there is only some LAD disease. ? ?Would recommend 72 hours anticoagulation (could use enoxaparin over IV heparin if necessary) ?=> For now aspirin and Plavix, and likely discontinue aspirin once an oral anticoagulant started. ?? Agree with ARB.  Titrate dose as blood pressure tolerates, could also add low-dose dihydropyridine calcium channel blocker if BP tolerates. ?? He is relatively bradycardic and therefore would not add a beta-blocker. ?? Correction benefit of statin, expressive is concerned about memory issues.  Would potentially start low-dose rosuvastatin. ?

## 2021-08-25 NOTE — Assessment & Plan Note (Addendum)
#)   Hypokalemia: Presenting serum potassium 3.1.  ?  ?Plan: Replaced ?  ?

## 2021-08-25 NOTE — Progress Notes (Deleted)
Subjective/Objective ?No new subjective & objective note has been filed under this hospital service since the last note was generated. ? ? ?Scheduled Meds: ?? aspirin EC  81 mg Oral Daily  ?? clopidogrel  75 mg Oral Daily  ?? donepezil  10 mg Oral Daily  ?? enoxaparin (LOVENOX) injection  75 mg Subcutaneous BID  ?? insulin aspart  0-6 Units Subcutaneous TID WC  ?? losartan  25 mg Oral Daily  ?? memantine  5 mg Oral BID  ?? tamsulosin  0.4 mg Oral QPC supper  ? ?Continuous Infusions: ?PRN Meds:acetaminophen **OR** acetaminophen, melatonin ? ?Vital signs in last 24 hours: ?Temp:  [97.8 ?F (36.6 ?C)-98.6 ?F (37 ?C)] 98.5 ?F (36.9 ?C) (03/24 2122) ?Pulse Rate:  [54-83] 82 (03/24 2122) ?Resp:  [13-23] 19 (03/24 2122) ?BP: (119-178)/(65-139) 144/85 (03/24 2122) ?SpO2:  [93 %-100 %] 95 % (03/24 2122) ? ?Intake/Output last 3 shifts: ?I/O last 3 completed shifts: ?In: 316.4 [I.V.:216.4; IV Piggyback:100] ?Out: 400 [Urine:400] ?Intake/Output this shift: ?No intake/output data recorded. ? ?Problem Assessment/Plan ?Cardiovascular and Mediastinum ?Coronary artery disease involving native coronary artery of native heart with angina pectoris ([place]) ?Assessment & Plan ?Has never been previously diagnosed, but now with presentation of ACS/STEMI and LAD distribution wall motion abnormality, but imagine that there is only some LAD disease. ? ?Would recommend 72 hours anticoagulation (could use enoxaparin over IV heparin if necessary) ?=> For now aspirin and Plavix, and likely discontinue aspirin once an oral anticoagulant started. ?? Agree with ARB.  Titrate dose as blood pressure tolerates, could also add low-dose dihydropyridine calcium channel blocker if BP tolerates. ?? He is relatively bradycardic and therefore would not titrate up beta-blocker. ?? Correction benefit of statin, expressive is concerned about memory issues.  Would potentially start low-dose rosuvastatin. ? ?* ST elevation myocardial infarction (STEMI) of anterior  wall ([place]) ?Assessment & Plan ?EKG suggesting anterior ST elevations, troponin elevation not very significant. ?Echocardiogram does suggest wall motion abnormality consistent with LAD infarct. ? ?There is also concern for LV thrombus. ? ?At this point, medical management is the best course of action.  No plans for invasive evaluation given his advanced dementia. ?? Would recommend 72 hours of IV heparin (or appropriately convert to treatment dose enoxaparin to avoid IV medications) simply for the STEMI/ACS--at that point we need to determine what to do as far as anticoagulation for the left ventricular thrombus. ?? Would recommend aspirin and Plavix while not on full anticoagulation for LV thrombus.  Once converted to oral anticoagulation for LV thrombus, would DC aspirin. ?? Low dose beta-blocker - cannot titrate as he is bradycardic. ?? Low-dose ARB added, with blood pressures being elevated, probably have room to titrate further as BP tolerates. ? ? ? ? ? ?Glenetta Hew, M.D., M.S. ?Interventional Cardiologist  ? ?Pager # [phone] ?Phone # [phone] ?[address] ?[place], [place] [address] ? ? ?For questions or updates, please contact [place] ?Please consult [email or web address] for contact info under  ?

## 2021-08-25 NOTE — Progress Notes (Signed)
ANTICOAGULATION CONSULT NOTE  ? ?Pharmacy Consult for heparin>>Lovenox ?Indication: chest pain/ACS ? ?No Known Allergies ? ?Patient Measurements: ?Height: '5\' 8"'$  (172.7 cm) ?Weight: 73 kg (160 lb 15 oz) ?IBW/kg (Calculated) : 68.4 ?Heparin Dosing Weight: 73 kg ? ?Vital Signs: ?Temp: 98.6 ?F (37 ?C) (03/24 1523) ?Temp Source: Axillary (03/24 1523) ?BP: 158/82 (03/24 1735) ?Pulse Rate: 57 (03/24 1523) ? ?Labs: ?Recent Labs  ?  08/24/21 ?1925 08/24/21 ?1942 08/24/21 ?1955 08/24/21 ?2111 08/25/21 ?0410 08/25/21 ?4034 08/25/21 ?1627  ?HGB 15.5 16.0  --   --   --   --   --   ?HCT 46.1 47.0  --   --   --   --   --   ?PLT 304  --   --   --   --   --   --   ?LABPROT  --   --  13.9  --   --   --   --   ?INR  --   --  1.1  --   --   --   --   ?HEPARINUNFRC  --   --   --   --  0.57  --  0.26*  ?CREATININE 1.42* 1.10  --   --  1.02  --   --   ?TROPONINIHS 12,524*  --   --  74,259*  --  14,686*  --   ? ? ? ?Estimated Creatinine Clearance: 50.3 mL/min (by C-G formula based on SCr of 1.02 mg/dL). ? ? ?Medical History: ?Past Medical History:  ?Diagnosis Date  ? BPH (benign prostatic hypertrophy)   ? Cataract   ? bil catarCTS REMOVED  ? Colon polyps   ? Diabetes mellitus   ? Gait abnormality 10/27/2019  ? Hernia   ? abdominal/ventral  ? Memory disorder 10/27/2019  ? Memory loss   ? Sleep apnea   ? last study 5 years ago- per patient SEVERE.  wears c_pap  ? ? ?Medications:  ?Medications Prior to Admission  ?Medication Sig Dispense Refill Last Dose  ? donepezil (ARICEPT) 10 MG tablet Take 1 tablet (10 mg total) by mouth daily. (Patient not taking: Reported on 08/25/2021) 90 tablet 3 Not Taking  ? losartan (COZAAR) 25 MG tablet Take 25 mg by mouth daily. Takes 1/2 tablet daily (Patient not taking: Reported on 08/25/2021)   Not Taking  ? Melatonin 3 MG CAPS Take 3 mg by mouth as needed.  (Patient not taking: Reported on 08/25/2021)   Not Taking  ? memantine (NAMENDA) 5 MG tablet Take 5 mg by mouth 2 (two) times daily. (Patient not taking:  Reported on 08/25/2021)   Not Taking  ? Tamsulosin HCl (FLOMAX) 0.4 MG CAPS Take 1 capsule (0.4 mg total) by mouth daily after supper. (Patient not taking: Reported on 08/25/2021) 30 capsule  Not Taking  ? ?Scheduled:  ? aspirin EC  81 mg Oral Daily  ? clopidogrel  75 mg Oral Daily  ? donepezil  10 mg Oral Daily  ? enoxaparin (LOVENOX) injection  75 mg Subcutaneous BID  ? insulin aspart  0-6 Units Subcutaneous TID WC  ? losartan  25 mg Oral Daily  ? memantine  5 mg Oral BID  ? tamsulosin  0.4 mg Oral QPC supper  ? ?Infusions:  ? ? ?Assessment: ?Patient presented after multiple fall. Pharmacy consulted to start heparin. Troponin is 12,524, lactic acid is 2.2, vitals and CBC are stable. F/u plans for anticoagulation during ACS work-up. ? ?Patient not taking anticoagulation PTA. ? ?  HL level subtherapeutic tonight. D/w Dr Louanne Belton and we will convert to Lovenox since it's just conservative treatment.  ? ?Goal of Therapy:  ?Anti-Xa 0.6-1 ?Monitor platelets by anticoagulation protocol: Yes ?  ?Plan:  ?Dc heparin ?Lovenox '75mg'$  SQ BID ? ?Onnie Boer, PharmD, BCIDP, AAHIVP, CPP ?Infectious Disease Pharmacist ?08/25/2021 5:43 PM ? ? ? ? ? ?

## 2021-08-25 NOTE — H&P (Signed)
?History and Physical  ? ? ?PLEASE NOTE THAT DRAGON DICTATION SOFTWARE WAS USED IN THE CONSTRUCTION OF THIS NOTE. ? ? ?Gregory Lamb NWG:[id number] DOB: 04/07/1934 DOA: 08/24/2021 ? ?PCP: Janie Morning, DO  ?Patient coming from: home  ? ?I have personally briefly reviewed patient's old medical records in [place] ? ?Chief Complaint: Falls ? ?HPI: KOFI MURRELL is a 88 y.o. male with medical history significant for advanced dementia, type 2 diabetes mellitus, benign prostatic hyperplasia, who is admitted to [facility] on 08/24/2021 with STEMI after presenting from home to [facility] ED for evaluation of falls.  ? ?The setting of the patient's advanced dementia, the following history is provided by the patient's daughter, in addition to my discussions with the EDP and via chart review. ? ?Patient's daughter witnessed the patient experience to falls over the course of the last week.  These falls were not associated loss of consciousness, and did not appear that the patient hit his head as component of these sequences.  Per daughter, it is unclear if the patient tripped over lost balance leading up to these falls.  The patient himself is without any acute complaint, and specifically denies any recent chest pain, shortness of breath, palpitations, diaphoresis, nausea, vomiting, dizziness, presyncope, or syncope.  He also denies any headache, neck pain, back pain, neck stiffness, abdominal pain, or any specific acute arthralgias or myalgias. ? ?Daughter conveys that the patient has advanced dementia, and confirms his DNR/DNI status.   ? ? ? ? ?ED Course:  ?Vital signs in the ED were notable for the following: Afebrile; heart rate 62-82; blood pressure 137/74; respiratory rate 15-21, oxygen saturation 95 to 100% on room air. ? ?Labs were notable for the following: CMP notable for the following: Potassium 3.1, creatinine 1.42 without any prior creatinine data points available per chart review for point  comparison, glucose 130, AST 100, otherwise liver enzymes found to be within normal limits.  BNP 121.  Initial high-sensitivity troponin I found to be 12,500, with repeat value trending up to 16,600, without any prior high-sensitivity troponin I values available for point comparison.  Lactic acid initially 2.2, with repeat value trending down to 1.4.  CBC notable for white cell count 10,500.  INR 1.1.  Urinalysis ordered, with result currently pending.  COVID-19/influenza PCR negative. ? ?Imaging and additional notable ED work-up: EKG, in comparison to most recent prior EKG from July 2013 shows sinus rhythm with single PVC, heart rate 82, T wave inversion in aVL, as well as ST elevation in leads V3 through V6, all of which appear new relative to most recent prior EKG from July 2013.  Chest x-ray shows bibasilar opacities, concerning for atelectasis versus pneumonia, without any evidence of edema, effusion, or pneumothorax. ? ?Cardiology consulted, with Dr. Alfred Levins evaluating the patient discussing the case with the patient's daughter (POA), who conveyed that the patient would not want any invasive diagnostic evaluation, including no cardiac catheterization, and that the patient would also not be amenable to reperfusion therapy, including stent placement nor CABG, even if maintenance of life was dependent upon these measures.  However, daughter conveys that the patient would be amenable to medical management for ACS, including heparin drip.  ? ?Preliminary additional recommendations from cardiology at this time relating to plans for medical management alone for presenting STEMI include aforementioned heparin drip, aspirin, initiation of low-dose beta-blocker, echocardiogram, in addition to evaluation for CAD risk factors, including pursuit of hemoglobin A1c as well as lipid panel. ? ?  While in the ED, the following were administered: Full dose aspirin x1, heparin bolus followed by initiation heparin drip; potassium  chloride 30 mill equivalents IV over 3 hours x 1 dose now. ? ?Subsequently, the patient was admitted for further evaluation and medical management alone of presenting STEMI.  ? ? ? ? ?Review of Systems: As per HPI otherwise 10 point review of systems negative.  ? ?Past Medical History:  ?Diagnosis Date  ? BPH (benign prostatic hypertrophy)   ? Cataract   ? bil catarCTS REMOVED  ? Colon polyps   ? Diabetes mellitus   ? Gait abnormality 10/27/2019  ? Hernia   ? abdominal/ventral  ? Memory disorder 10/27/2019  ? Memory loss   ? Sleep apnea   ? last study 5 years ago- per patient SEVERE.  wears c_pap  ? ? ?Past Surgical History:  ?Procedure Laterality Date  ? COLONOSCOPY    ? EYE SURGERY    ? cataract extraction with IOL  ? hard of hearing    ? wear bil haring aids  ? LIPOMA EXCISION    ? back  ? POLYPECTOMY    ? TONSILLECTOMY    ? TRANSURETHRAL RESECTION OF PROSTATE  12/12/2011  ? Procedure: TRANSURETHRAL RESECTION OF THE PROSTATE WITH GYRUS INSTRUMENTS;  Surgeon: Franchot Gallo, MD;  Location: [facility] ORS;  Service: Urology;  Laterality: N/A;  ? UPPER GASTROINTESTINAL ENDOSCOPY    ? ? ?Social History: ? reports that he quit smoking about 41 years ago. His smoking use included cigarettes. He has a 50.00 pack-year smoking history. He has quit using smokeless tobacco. He reports current alcohol use of about 7.0 standard drinks per week. He reports that he does not use drugs. ? ? ?No Known Allergies ? ?Family History  ?Problem Relation Age of Onset  ? Colon cancer Neg Hx   ? Esophageal cancer Neg Hx   ? Liver cancer Neg Hx   ? Pancreatic cancer Neg Hx   ? Prostate cancer Neg Hx   ? Rectal cancer Neg Hx   ? Stomach cancer Neg Hx   ? ? ?Family history reviewed and not pertinent  ? ? ?Prior to Admission medications   ?Medication Sig Start Date End Date Taking? Authorizing Provider  ?aspirin EC 81 MG tablet Take 81 mg by mouth daily.    [provider]  ?azelastine (ASTELIN) 0.1 % nasal spray Place 1 spray into both  nostrils 2 (two) times daily. Use in each nostril as directed    [provider]  ?CALCIUM-VITAMIN D PO Take by mouth.    [provider]  ?diphenhydrAMINE (BENADRYL) 25 MG tablet Take 25 mg by mouth daily as needed. Allergies    [provider]  ?donepezil (ARICEPT) 10 MG tablet Take 1 tablet (10 mg total) by mouth daily. 10/27/19   Kathrynn Ducking, MD  ?losartan (COZAAR) 25 MG tablet Take 25 mg by mouth daily. Takes 1/2 tablet daily    [provider]  ?Melatonin 3 MG CAPS Take 3 mg by mouth as needed.     [provider]  ?memantine (NAMENDA) 5 MG tablet Take 5 mg by mouth 2 (two) times daily. 08/18/19   [provider]  ?metFORMIN (GLUCOPHAGE) 500 MG tablet Take 500 mg by mouth 2 (two) times daily with a meal.    [provider]  ?Omega-3 Fatty Acids (FISH OIL) 1200 MG CAPS Take 1,200 mg by mouth daily.    [provider]  ?Tamsulosin HCl (FLOMAX) 0.4  MG CAPS Take 1 capsule (0.4 mg total) by mouth daily after supper. 12/13/11   Franchot Gallo, MD  ? ? ? ?Objective  ? ? ?Physical Exam: ?Vitals:  ? 08/24/21 2130 08/24/21 2230 08/25/21 0000 08/25/21 0109  ?BP: (!) 160/99 (!) 157/74 (!) 153/92 (!) 159/139  ?Pulse: (!) 58 (!) 59 66 83  ?Resp: (!) 23 (!) '21 19 19  '$ ?Temp:      ?SpO2: 97% 95% 96% 94%  ?Weight:      ?Height:      ? ? ?General: appears to be stated age; alert; confused (baseline) ?Skin: warm, dry, no rash ?Head:  AT/ ?Mouth:  Oral mucosa membranes appear moist, normal dentition ?Neck: supple; trachea midline ?Heart:  RRR; did not appreciate any M/R/G ?Lungs: CTAB, did not appreciate any wheezes, rales, or rhonchi ?Abdomen: + BS; soft, ND, NT ?Vascular: 2+ pedal pulses b/l; 2+ radial pulses b/l ?Extremities: no peripheral edema, no muscle wasting ?Neuro: strength and sensation intact in upper and lower extremities b/l ? ? ? ?Labs on Admission: I have personally reviewed following labs and imaging studies ? ?CBC: ?Recent Labs  ?Lab  08/24/21 ?1925 08/24/21 ?1942  ?WBC 10.5  --   ?NEUTROABS 7.2  --   ?HGB 15.5 16.0  ?HCT 46.1 47.0  ?MCV 89.2  --   ?PLT 304  --   ? ?Basic Metabolic Panel: ?Recent Labs  ?Lab 08/24/21 ?1925 08/24/21 ?1942  ?NA 139 139

## 2021-08-26 DIAGNOSIS — G309 Alzheimer's disease, unspecified: Secondary | ICD-10-CM | POA: Diagnosis not present

## 2021-08-26 DIAGNOSIS — I213 ST elevation (STEMI) myocardial infarction of unspecified site: Secondary | ICD-10-CM | POA: Diagnosis not present

## 2021-08-26 DIAGNOSIS — N400 Enlarged prostate without lower urinary tract symptoms: Secondary | ICD-10-CM | POA: Diagnosis not present

## 2021-08-26 DIAGNOSIS — E119 Type 2 diabetes mellitus without complications: Secondary | ICD-10-CM | POA: Diagnosis not present

## 2021-08-26 DIAGNOSIS — I2102 ST elevation (STEMI) myocardial infarction involving left anterior descending coronary artery: Secondary | ICD-10-CM

## 2021-08-26 LAB — BASIC METABOLIC PANEL
Anion gap: 10 (ref 5–15)
BUN: 15 mg/dL (ref 8–23)
CO2: 21 mmol/L — ABNORMAL LOW (ref 22–32)
Calcium: 9.2 mg/dL (ref 8.9–10.3)
Chloride: 105 mmol/L (ref 98–111)
Creatinine, Ser: 0.98 mg/dL (ref 0.61–1.24)
GFR, Estimated: >60 mL/min (ref >60)
Glucose, Bld: 120 mg/dL — ABNORMAL HIGH (ref 70–99)
Potassium: 3.1 mmol/L — ABNORMAL LOW (ref 3.5–5.1)
Sodium: 136 mmol/L (ref 135–145)

## 2021-08-26 LAB — GLUCOSE, CAPILLARY
Glucose-Capillary: 111 mg/dL — ABNORMAL HIGH (ref 70–99)
Glucose-Capillary: 128 mg/dL — ABNORMAL HIGH (ref 70–99)
Glucose-Capillary: 146 mg/dL — ABNORMAL HIGH (ref 70–99)
Glucose-Capillary: 171 mg/dL — ABNORMAL HIGH (ref 70–99)

## 2021-08-26 LAB — CBC
HCT: 41.7 % (ref 39.0–52.0)
Hemoglobin: 14.6 g/dL (ref 13.0–17.0)
MCH: 30.1 pg (ref 26.0–34.0)
MCHC: 35 g/dL (ref 30.0–36.0)
MCV: 86 fL (ref 80.0–100.0)
Platelets: 285 10*3/uL (ref 150–400)
RBC: 4.85 MIL/uL (ref 4.22–5.81)
RDW: 12.3 % (ref 11.5–15.5)
WBC: 10.8 10*3/uL — ABNORMAL HIGH (ref 4.0–10.5)
nRBC: 0 % (ref 0.0–0.2)

## 2021-08-26 LAB — MAGNESIUM: Magnesium: 1.8 mg/dL (ref 1.7–2.4)

## 2021-08-26 MED ORDER — METOPROLOL TARTRATE 12.5 MG HALF TABLET
12.5000 mg | ORAL_TABLET | Freq: Two times a day (BID) | ORAL | Status: DC
  Administered 2021-08-26 – 2021-08-30 (×8): 12.5 mg via ORAL
  Filled 2021-08-26 (×8): qty 1

## 2021-08-26 MED ORDER — POTASSIUM CHLORIDE CRYS ER 20 MEQ PO TBCR
40.0000 meq | EXTENDED_RELEASE_TABLET | Freq: Two times a day (BID) | ORAL | Status: DC
  Administered 2021-08-26: 40 meq via ORAL
  Filled 2021-08-26: qty 2

## 2021-08-26 NOTE — Progress Notes (Signed)
? ?Progress Note ? ?Patient Name: Gregory Lamb ?Date of Encounter: 08/26/2021 ? ?[facility] Cardiologist: None  ? ?Subjective  ? ?BP 129/75.  Oriented x1.  Denies pain. ? ?Inpatient Medications  ?  ?Scheduled Meds: ? aspirin EC  81 mg Oral Daily  ? clopidogrel  75 mg Oral Daily  ? donepezil  10 mg Oral Daily  ? enoxaparin (LOVENOX) injection  75 mg Subcutaneous BID  ? insulin aspart  0-6 Units Subcutaneous TID WC  ? losartan  25 mg Oral Daily  ? memantine  5 mg Oral BID  ? tamsulosin  0.4 mg Oral QPC supper  ? ?Continuous Infusions: ? ?PRN Meds: ?acetaminophen **OR** acetaminophen, melatonin  ? ?Vital Signs  ?  ?Vitals:  ? 08/25/21 1950 08/25/21 2122 08/26/21 0003 08/26/21 0533  ?BP:  (!) 144/85 (!) 131/95 129/75  ?Pulse:  82 62 72  ?Resp: '18 19 16 19  '$ ?Temp:  98.5 ?F (36.9 ?C) 98.5 ?F (36.9 ?C) 98.5 ?F (36.9 ?C)  ?TempSrc:  Oral Oral Oral  ?SpO2:  95% 95% 95%  ?Weight:    72.2 kg  ?Height:      ? ? ?Intake/Output Summary (Last 24 hours) at 08/26/2021 0934 ?Last data filed at 08/25/2021 1734 ?Gross per 24 hour  ?Intake 75.76 ml  ?Output --  ?Net 75.76 ml  ? ? ?  08/26/2021  ?  5:33 AM 08/24/2021  ?  7:08 PM 10/27/2019  ?  8:11 AM  ?Last 3 Weights  ?Weight (lbs) 159 lb 2.8 oz 160 lb 15 oz 161 lb  ?Weight (kg) 72.2 kg 73 kg 73.029 kg  ?   ? ?Telemetry  ?  ?Normal sinus rhythm- Personally Reviewed ? ?ECG  ?  ?No new ECG- Personally Reviewed ? ?Physical Exam  ? ?GEN: No acute distress.   ?Neck: No JVD ?Cardiac: RRR, no murmurs, rubs, or gallops.  ?Respiratory: Clear to auscultation bilaterally. ?GI: Soft, nontender ?MS: No edema; No deformity. ?Neuro: Oriented x1 ?Psych: Unable to assess ? ?Labs  ?  ?High Sensitivity Troponin:   ?Recent Labs  ?Lab 08/24/21 ?1925 08/24/21 ?2111 08/25/21 ?1610  ?TROPONINIHS [id number]* [id number]* 96,045*  ?   ?Chemistry ?Recent Labs  ?Lab 08/24/21 ?1925 08/24/21 ?1942 08/25/21 ?0410 08/26/21 ?0248  ?NA 139 139 139 136  ?K 3.1* 3.0* 3.8 3.1*  ?CL 103 103 108 105  ?CO2 21*  --  19* 21*   ?GLUCOSE 130* 129* 115* 120*  ?BUN 21 25* 21 15  ?CREATININE 1.42* 1.10 1.02 0.98  ?CALCIUM 9.4  --  9.2 9.2  ?MG  --   --  1.9 1.8  ?PROT 6.3*  --  6.5  --   ?ALBUMIN 3.5  --  3.4*  --   ?AST 100*  --  93*  --   ?ALT 23  --  24  --   ?ALKPHOS 58  --  64  --   ?BILITOT 0.8  --  1.2  --   ?GFRNONAA 48*  --  >60 >60  ?ANIONGAP 15  --  12 10  ?  ?Lipids  ?Recent Labs  ?Lab 08/25/21 ?4098  ?CHOL 167  ?TRIG 59  ?HDL 40*  ?LDLCALC 115*  ?CHOLHDL 4.2  ?  ?Hematology ?Recent Labs  ?Lab 08/24/21 ?1925 08/24/21 ?1942 08/26/21 ?0248  ?WBC 10.5  --  10.8*  ?RBC 5.17  --  4.85  ?HGB 15.5 16.0 14.6  ?HCT 46.1 47.0 41.7  ?MCV 89.2  --  86.0  ?MCH 30.0  --  30.1  ?MCHC 33.6  --  35.0  ?RDW 12.4  --  12.3  ?PLT 304  --  285  ? ?Thyroid  ?Recent Labs  ?Lab 08/25/21 ?1340  ?TSH 0.473  ?  ?BNP ?Recent Labs  ?Lab 08/24/21 ?1925  ?BNP 121.7*  ?  ?DDimer No results for input(s): DDIMER in the last 168 hours.  ? ?Radiology  ?  ?CT Head Wo Contrast ? ?Result Date: 08/24/2021 ?CLINICAL DATA:  Mental status change, unknown cause EXAM: CT HEAD WITHOUT CONTRAST TECHNIQUE: Contiguous axial images were obtained from the base of the skull through the vertex without intravenous contrast. RADIATION DOSE REDUCTION: This exam was performed according to the departmental dose-optimization program which includes automated exposure control, adjustment of the mA and/or kV according to patient size and/or use of iterative reconstruction technique. COMPARISON:  CT head Oct 15, 2016.  MRI head November 06, 2019. FINDINGS: Brain: No evidence of acute infarction, acute hemorrhage, hydrocephalus, extra-axial collection or mass lesion/mass effect. Cerebral atrophy with mesial temporal predominance. Associated ex vacuo ventricular dilation. Mild patchy white matter hypoattenuation, nonspecific but compatible with chronic microvascular ischemic disease. Vascular: No hyperdense vessel identified. Skull: No acute orbital findings. Sinuses/Orbits: Small probable retention  cyst in the right sphenoid sinus. Otherwise, clear sinuses. No acute orbital findings. Other: No mastoid effusions. IMPRESSION: 1. No evidence of acute intracranial abnormality. 2. Cerebral atrophy with mesial temporal predominance, which is nonspecific but can be seen with Alzheimer's disease. Electronically Signed   By: Margaretha Sheffield M.D.   On: 08/24/2021 20:49  ? ?DG Chest Port 1 View ? ?Result Date: 08/24/2021 ?CLINICAL DATA:  Mental status change EXAM: PORTABLE CHEST 1 VIEW COMPARISON:  Radiograph 12/10/2011 FINDINGS: Unchanged cardiomediastinal silhouette. Left basilar opacities. Streaky right basilar opacities. No large pleural effusion. No visible pneumothorax. No acute osseous abnormality. IMPRESSION: Left basilar opacity and streaky right basilar opacities, which could be atelectasis or pneumonia. Electronically Signed   By: Maurine Simmering M.D.   On: 08/24/2021 20:03  ? ?ECHOCARDIOGRAM COMPLETE ? ?Result Date: 08/25/2021 ?   ECHOCARDIOGRAM REPORT   Patient Name:   THONG FEENY Date of Exam: 08/25/2021 Medical Rec #:  [id number]        Height:       68.0 in Accession #:    [id number]       Weight:       160.9 lb Date of Birth:  [birth year]        BSA:          1.863 m? Patient Age:    88 years         BP:           159/82 mmHg Patient Gender: M                HR:           71 bpm. Exam Location:  Inpatient Procedure: 2D Echo and Intracardiac Opacification Agent Indications:    NSTEMI  History:        Patient has no prior history of Echocardiogram examinations.                 Risk Factors:Diabetes.  Sonographer:    Johny Chess RDCS Referring Phys: [id number] [place]  1. Left ventricular ejection fraction, by estimation, is 40 to 45%. The left ventricle has mildly decreased function. The left ventricle demonstrates regional wall motion abnormalities (see scoring diagram/findings for description). Left ventricular diastolic parameters were normal.  2. Right  ventricular systolic  function is normal. The right ventricular size is normal.  3. The mitral valve is grossly normal. Trivial mitral valve regurgitation. No evidence of mitral stenosis.  4. The aortic valve was not well visualized. There is mild calcification of the aortic valve. There is mild thickening of the aortic valve. Aortic valve regurgitation is not visualized. No aortic stenosis is present.  5. The inferior vena cava is normal in size with greater than 50% respiratory variability, suggesting right atrial pressure of 3 mmHg. Comparison(s): No prior Echocardiogram. Conclusion(s)/Recommendation(s): Focal wall motion abnormalities in the mid to apical septal walls and peri-apical portions of the anterior and lateral walls. There is a small adherent LV thrombus seen (see image 80) at the apical anteroseptum. Findings communicated with Dr. Louanne Belton and the cardiology rounding team. FINDINGS  Left Ventricle: Left ventricular ejection fraction, by estimation, is 40 to 45%. The left ventricle has mildly decreased function. The left ventricle demonstrates regional wall motion abnormalities. Definity contrast agent was given IV to delineate the left ventricular endocardial borders. The left ventricular internal cavity size was normal in size. There is no left ventricular hypertrophy. Left ventricular diastolic parameters were normal.  LV Wall Scoring: The apical septal segment, apical inferior segment, and apex are akinetic. The mid anteroseptal segment, apical lateral segment, mid inferoseptal segment, apical anterior segment, and mid inferior segment are hypokinetic. The anterior wall, antero-lateral wall, posterior wall, basal anteroseptal segment, basal inferior segment, and basal inferoseptal segment are normal. Right Ventricle: The right ventricular size is normal. No increase in right ventricular wall thickness. Right ventricular systolic function is normal. Left Atrium: Left atrial size was normal in size. Right Atrium: Right  atrial size was normal in size. Pericardium: There is no evidence of pericardial effusion. Presence of epicardial fat layer. Mitral Valve: The mitral valve is grossly normal. There is mild thickening of the mitral valv

## 2021-08-26 NOTE — Progress Notes (Addendum)
?PROGRESS NOTE ? ? ? ?Gregory Lamb  OYD:[id number] DOB: 1933/09/23 DOA: 08/24/2021 ?PCP: Janie Morning, DO  ? ? ?Brief Narrative:  ?Gregory Lamb is a 88 y.o. male with medical history significant for advanced dementia, type 2 diabetes mellitus, benign prostatic hyperplasia, presented to hospital after sustaining a fall unclear etiology.  In the ED, patient had normal blood pressure.  Mildly tachypneic.  There was mild hypokalemia with potassium of 3.1 and creatinine of 1.4.  Troponin I was elevated at 12500 with up to 16 600.  Lactate was elevated at 2.2.  CBC mildly elevated at 10.5 K.  EKG showed ST elevation in leads V3 through V6, all of which appear new relative to most recent prior EKG from July 2013.  Chest x-ray shows bibasilar opacities, concerning for atelectasis versus pneumonia.  Cardiology was consulted from the ED and the case was discussed with patient's daughter.  At this time, plan is to proceed with medical management and patient was started on heparin drip. ?  ?Assessment and Plan: ? ?STEMI (ST elevation myocardial infarction) ([place]) ?Patient presenting with a significantly elevated troponin with EKG changes suggestive of ST elevation MI.  Cardiology on board and likely LAD territory infarct.  Currently on heparin drip and conservative treatment.  Plan is to continue heparin drip for 72 hours.  Patient had significant bradycardia so not on beta-blocker but on low-dose losartan.  No plans for invasive procedure.  Continue, aspirin.  2D echocardiogram from 08/25/2021 showed LV ejection fraction of 40 to 45% with regional wall motion abnormality in the mid to apical septal walls with a small adherent LV thrombus.  Patient is currently on IV heparin.  Patient will need anticoagulation for this blood clot but is unlikely to be a good warfarin candidate.  Could consider Eliquis on discharge, lipid panel noted with LDL of 115.  Magnesium 1.8. ?  ?DM2 (diabetes mellitus, type 2) ([place]) ?Continue  sliding scale insulin.  Hemoglobin A1c of 6.2.  Distribution glucose of 111 ?   ?BPH (benign prostatic hyperplasia) ?Continue tamsulosin. ?   ?Lactic acidosis ?Elevated on presentation.  Trended down.  Was on metformin as outpatient but is on hold.  No signs of infection. Procalcitonin 0.10 ? ?Hypokalemia ?Potassium of 3.1 today.  We will replenish.  Check levels in a.m. ? ?Advanced dementia   Continue memantine. ?  ? DVT prophylaxis:   Heparin drip ? ? ?Code Status:   ?  Code Status: DNR ? ?Disposition: Home ?Status is: Inpatient ? ?Remains inpatient appropriate because: ST elevation MI on conservative treatment, plan for heparin drip for 72 hours, ? ? Family Communication:  ? ? I spoke with the patient's daughter on the phone and updated her about the clinical condition of the patient on 08/25/2021.  Overall prognosis seems to be guarded at this time. ? ?Consultants:  ?Cardiology ? ?Procedures:  ?None ? ?Antimicrobials:  ?None ? ?Anti-infectives (From admission, onward)  ? ? None  ? ?  ? ?Subjective: ?Today, patient was seen and examined at bedside.  He has advanced dementia.  Only says no stool question.  Appears slightly sleepy but denies any pain ? ?Objective: ?Vitals:  ? 08/25/21 1950 08/25/21 2122 08/26/21 0003 08/26/21 0533  ?BP:  (!) 144/85 (!) 131/95 129/75  ?Pulse:  82 62 72  ?Resp: '18 19 16 19  '$ ?Temp:  98.5 ?F (36.9 ?C) 98.5 ?F (36.9 ?C) 98.5 ?F (36.9 ?C)  ?TempSrc:  Oral Oral Oral  ?SpO2:  95% 95% 95%  ?  Weight:    72.2 kg  ?Height:      ? ? ?Intake/Output Summary (Last 24 hours) at 08/26/2021 0813 ?Last data filed at 08/25/2021 1734 ?Gross per 24 hour  ?Intake 216.37 ml  ?Output 400 ml  ?Net -183.63 ml  ? ? ?Filed Weights  ? 08/24/21 1908 08/26/21 0533  ?Weight: 73 kg 72.2 kg  ? ? ?Physical Examination: ? ?General:  Average built, not in obvious distress, advanced dementia, poor historian, ?HENT:   No scleral pallor or icterus noted. Oral mucosa is moist.  ?Chest:  Clear breath sounds.  Diminished breath  sounds bilaterally. No crackles or wheezes.  ?CVS: S1 &S2 heard. No murmur.  Regular rate and rhythm. ?Abdomen: Soft, nontender, nondistended.  Bowel sounds are heard.   ?Extremities: No cyanosis, clubbing or edema.  Peripheral pulses are palpable. ?Psych: Elderly male with dementia, lmildly sleepy but answering no to all questions. ?CNS:  No cranial nerve deficits.  Moves extremities ?Skin: Warm and dry.  No rashes noted. ? ? ?Data Reviewed:  ? ?CBC: ?Recent Labs  ?Lab 08/24/21 ?1925 08/24/21 ?1942 08/26/21 ?0248  ?WBC 10.5  --  10.8*  ?NEUTROABS 7.2  --   --   ?HGB 15.5 16.0 14.6  ?HCT 46.1 47.0 41.7  ?MCV 89.2  --  86.0  ?PLT 304  --  285  ? ? ? ?Basic Metabolic Panel: ?Recent Labs  ?Lab 08/24/21 ?1925 08/24/21 ?1942 08/25/21 ?0410 08/26/21 ?0248  ?NA 139 139 139 136  ?K 3.1* 3.0* 3.8 3.1*  ?CL 103 103 108 105  ?CO2 21*  --  19* 21*  ?GLUCOSE 130* 129* 115* 120*  ?BUN 21 25* 21 15  ?CREATININE 1.42* 1.10 1.02 0.98  ?CALCIUM 9.4  --  9.2 9.2  ?MG  --   --  1.9 1.8  ? ? ? ?Liver Function Tests: ?Recent Labs  ?Lab 08/24/21 ?1925 08/25/21 ?0410  ?AST 100* 93*  ?ALT 23 24  ?ALKPHOS 58 64  ?BILITOT 0.8 1.2  ?PROT 6.3* 6.5  ?ALBUMIN 3.5 3.4*  ? ? ? ? ?Radiology Studies: ?CT Head Wo Contrast ? ?Result Date: 08/24/2021 ?CLINICAL DATA:  Mental status change, unknown cause EXAM: CT HEAD WITHOUT CONTRAST TECHNIQUE: Contiguous axial images were obtained from the base of the skull through the vertex without intravenous contrast. RADIATION DOSE REDUCTION: This exam was performed according to the departmental dose-optimization program which includes automated exposure control, adjustment of the mA and/or kV according to patient size and/or use of iterative reconstruction technique. COMPARISON:  CT head Oct 15, 2016.  MRI head November 06, 2019. FINDINGS: Brain: No evidence of acute infarction, acute hemorrhage, hydrocephalus, extra-axial collection or mass lesion/mass effect. Cerebral atrophy with mesial temporal predominance.  Associated ex vacuo ventricular dilation. Mild patchy white matter hypoattenuation, nonspecific but compatible with chronic microvascular ischemic disease. Vascular: No hyperdense vessel identified. Skull: No acute orbital findings. Sinuses/Orbits: Small probable retention cyst in the right sphenoid sinus. Otherwise, clear sinuses. No acute orbital findings. Other: No mastoid effusions. IMPRESSION: 1. No evidence of acute intracranial abnormality. 2. Cerebral atrophy with mesial temporal predominance, which is nonspecific but can be seen with Alzheimer's disease. Electronically Signed   By: Margaretha Sheffield M.D.   On: 08/24/2021 20:49  ? ?DG Chest Port 1 View ? ?Result Date: 08/24/2021 ?CLINICAL DATA:  Mental status change EXAM: PORTABLE CHEST 1 VIEW COMPARISON:  Radiograph 12/10/2011 FINDINGS: Unchanged cardiomediastinal silhouette. Left basilar opacities. Streaky right basilar opacities. No large pleural effusion. No visible pneumothorax. No acute  osseous abnormality. IMPRESSION: Left basilar opacity and streaky right basilar opacities, which could be atelectasis or pneumonia. Electronically Signed   By: Maurine Simmering M.D.   On: 08/24/2021 20:03  ? ?ECHOCARDIOGRAM COMPLETE ? ?Result Date: 08/25/2021 ?   ECHOCARDIOGRAM REPORT   Patient Name:   GARNELL PHENIX Date of Exam: 08/25/2021 Medical Rec #:  [id number]        Height:       68.0 in Accession #:    [id number]       Weight:       160.9 lb Date of Birth:  November 21, 1933        BSA:          1.863 m? Patient Age:    81 years         BP:           159/82 mmHg Patient Gender: M                HR:           71 bpm. Exam Location:  Inpatient Procedure: 2D Echo and Intracardiac Opacification Agent Indications:    NSTEMI  History:        Patient has no prior history of Echocardiogram examinations.                 Risk Factors:Diabetes.  Sonographer:    Johny Chess RDCS Referring Phys: [id number] [place]  1. Left ventricular ejection fraction, by  estimation, is 40 to 45%. The left ventricle has mildly decreased function. The left ventricle demonstrates regional wall motion abnormalities (see scoring diagram/findings for description). Left ventricular diastolic pa

## 2021-08-27 DIAGNOSIS — I255 Ischemic cardiomyopathy: Secondary | ICD-10-CM

## 2021-08-27 DIAGNOSIS — I236 Thrombosis of atrium, auricular appendage, and ventricle as current complications following acute myocardial infarction: Secondary | ICD-10-CM

## 2021-08-27 DIAGNOSIS — G309 Alzheimer's disease, unspecified: Secondary | ICD-10-CM | POA: Diagnosis not present

## 2021-08-27 DIAGNOSIS — E119 Type 2 diabetes mellitus without complications: Secondary | ICD-10-CM | POA: Diagnosis not present

## 2021-08-27 DIAGNOSIS — I25119 Atherosclerotic heart disease of native coronary artery with unspecified angina pectoris: Secondary | ICD-10-CM | POA: Diagnosis not present

## 2021-08-27 DIAGNOSIS — N400 Enlarged prostate without lower urinary tract symptoms: Secondary | ICD-10-CM | POA: Diagnosis not present

## 2021-08-27 DIAGNOSIS — F03C0 Unspecified dementia, severe, without behavioral disturbance, psychotic disturbance, mood disturbance, and anxiety: Secondary | ICD-10-CM

## 2021-08-27 DIAGNOSIS — I2109 ST elevation (STEMI) myocardial infarction involving other coronary artery of anterior wall: Secondary | ICD-10-CM

## 2021-08-27 DIAGNOSIS — R413 Other amnesia: Secondary | ICD-10-CM

## 2021-08-27 LAB — BASIC METABOLIC PANEL
Anion gap: 5 (ref 5–15)
BUN: 18 mg/dL (ref 8–23)
CO2: 23 mmol/L (ref 22–32)
Calcium: 9.2 mg/dL (ref 8.9–10.3)
Chloride: 108 mmol/L (ref 98–111)
Creatinine, Ser: 1.02 mg/dL (ref 0.61–1.24)
GFR, Estimated: >60 mL/min (ref >60)
Glucose, Bld: 114 mg/dL — ABNORMAL HIGH (ref 70–99)
Potassium: 3.2 mmol/L — ABNORMAL LOW (ref 3.5–5.1)
Sodium: 136 mmol/L (ref 135–145)

## 2021-08-27 LAB — CBC
HCT: 42.7 % (ref 39.0–52.0)
Hemoglobin: 14.7 g/dL (ref 13.0–17.0)
MCH: 29.9 pg (ref 26.0–34.0)
MCHC: 34.4 g/dL (ref 30.0–36.0)
MCV: 86.8 fL (ref 80.0–100.0)
Platelets: 304 10*3/uL (ref 150–400)
RBC: 4.92 MIL/uL (ref 4.22–5.81)
RDW: 12.5 % (ref 11.5–15.5)
WBC: 8.6 10*3/uL (ref 4.0–10.5)
nRBC: 0 % (ref 0.0–0.2)

## 2021-08-27 LAB — GLUCOSE, CAPILLARY
Glucose-Capillary: 115 mg/dL — ABNORMAL HIGH (ref 70–99)
Glucose-Capillary: 129 mg/dL — ABNORMAL HIGH (ref 70–99)
Glucose-Capillary: 168 mg/dL — ABNORMAL HIGH (ref 70–99)
Glucose-Capillary: 121 mg/dL — ABNORMAL HIGH (ref 70–99)

## 2021-08-27 LAB — MAGNESIUM: Magnesium: 1.8 mg/dL (ref 1.7–2.4)

## 2021-08-27 MED ORDER — POTASSIUM CHLORIDE 10 MEQ/100ML IV SOLN
10.0000 meq | INTRAVENOUS | Status: AC
  Administered 2021-08-27 (×4): 10 meq via INTRAVENOUS
  Filled 2021-08-27 (×4): qty 100

## 2021-08-27 MED ORDER — LORAZEPAM 0.5 MG PO TABS
0.5000 mg | ORAL_TABLET | Freq: Four times a day (QID) | ORAL | Status: DC | PRN
  Filled 2021-08-27: qty 1

## 2021-08-27 NOTE — Plan of Care (Signed)

## 2021-08-27 NOTE — Progress Notes (Signed)
?PROGRESS NOTE ? ? ? ?Gregory Lamb  GQQ:[id number] DOB: 1934/03/12 DOA: 08/24/2021 ?PCP: Janie Morning, DO  ? ? ?Brief Narrative:  ?Gregory Lamb is a 88 y.o. male with medical history significant for advanced dementia, type 2 diabetes mellitus, benign prostatic hyperplasia, presented to hospital after sustaining a fall unclear etiology.  In the ED, patient had normal blood pressure.  Mildly tachypneic.  There was mild hypokalemia with potassium of 3.1 and creatinine of 1.4.  Troponin I was elevated at 12500 with up to 16 600.  Lactate was elevated at 2.2.  CBC mildly elevated at 10.5 K.  EKG showed ST elevation in leads V3 through V6, all of which appear new relative to most recent prior EKG from July 2013.  Chest x-ray shows bibasilar opacities, concerning for atelectasis versus pneumonia.  Cardiology was consulted from the ED and the case was discussed with patient's daughter.  At this time, plan is to proceed with medical management and patient was started on heparin drip. ?  ?Assessment and Plan: ? ?STEMI (ST elevation myocardial infarction) ([place]) ?Patient presenting with a significantly elevated troponin with EKG changes suggestive of ST elevation MI.  Cardiology on board and likely LAD territory infarct.  On therapeutic Lovenox due to LV thrombus.  Metoprolol has been added since there is improvement in his heart beat at this time.  No plans for invasive procedure.  Continue, aspirin.  2D echocardiogram from 08/25/2021 showed LV ejection fraction of 40 to 45% with regional wall motion abnormality in the mid to apical septal walls with a small adherent LV thrombus.   Patient will need anticoagulation for this blood clot but is unlikely to be a good warfarin candidate.  Could consider Eliquis on discharge, lipid panel noted with LDL of 115.  Magnesium 1.8. ?  ?DM2 (diabetes mellitus, type 2) ([place]) ?Continue sliding scale insulin.  Hemoglobin A1c of 6.2.  Latest POC glucose of 129 ?   ?BPH (benign prostatic  hyperplasia) ?Continue tamsulosin. ?   ?Lactic acidosis ?Elevated on presentation.  Trended down.  Was on metformin as outpatient but is on hold.  No signs of infection. Procalcitonin 0.10 ? ?Hypokalemia ?Potassium of 3.2 today.  Received p.o. potassium yesterday.  We will give 40 mg of IV potassium today. ? ?Advanced dementia   Continue memantine. ?  ? DVT prophylaxis:   Lovenox therapeutic dose ? ? ?Code Status:   ?  Code Status: DNR ? ?Disposition: Home likely in one day or so, with the family supervision at home ? ?Status is: Inpatient ? ?Remains inpatient appropriate because: ST elevation MI on conservative treatment, LV thrombus on anticoagulation ? ? Family Communication:  ? I spoke with the patient's daughter on the phone  on 08/25/2021.  ? ?Consultants:  ?Cardiology ? ?Procedures:  ?None ? ?Antimicrobials:  ?None ? ?Anti-infectives (From admission, onward)  ? ? None  ? ?  ? ?Subjective: ?Today, patient was seen and examined at bedside.  Patient has advanced dementia and poor historian.  Denies any chest pain shortness of breath.   ? ?Objective: ?Vitals:  ? 08/27/21 0036 08/27/21 0417 08/27/21 0437 08/27/21 1014  ?BP: (!) 141/70 (!) 155/77  111/76  ?Pulse: 69 64  78  ?Resp: '17 19  19  '$ ?Temp: (!) 97.5 ?F (36.4 ?C) 98.9 ?F (37.2 ?C)  98 ?F (36.7 ?C)  ?TempSrc: Oral Axillary  Axillary  ?SpO2: 97% 97%  96%  ?Weight:   72.4 kg   ?Height:      ? ? ?  Intake/Output Summary (Last 24 hours) at 08/27/2021 1124 ?Last data filed at 08/27/2021 0429 ?Gross per 24 hour  ?Intake --  ?Output 400 ml  ?Net -400 ml  ? ? ?Filed Weights  ? 08/24/21 1908 08/26/21 0533 08/27/21 0437  ?Weight: 73 kg 72.2 kg 72.4 kg  ? ? ?Physical Examination: ? ?General:  Average built, not in obvious distress, advanced dementia, poor historian ?HENT:   No scleral pallor or icterus noted. Oral mucosa is moist.  ?Chest:  Clear breath sounds.  Diminished breath sounds bilaterally. No crackles or wheezes.  ?CVS: S1 &S2 heard. No murmur.  Regular rate and  rhythm. ?Abdomen: Soft, nontender, nondistended.  Bowel sounds are heard.   ?Extremities: No cyanosis, clubbing or edema.  Peripheral pulses are palpable. ?Psych: Alert, awake and communicative.  Has underlying dementia. ?CNS:  No cranial nerve deficits.  Moves all extremities.  Saying okay and oriented to self. ?Skin: Warm and dry.  No rashes noted. ? ?Data Reviewed:  ? ?CBC: ?Recent Labs  ?Lab 08/24/21 ?1925 08/24/21 ?1942 08/26/21 ?0248 08/27/21 ?4098  ?WBC 10.5  --  10.8* 8.6  ?NEUTROABS 7.2  --   --   --   ?HGB 15.5 16.0 14.6 14.7  ?HCT 46.1 47.0 41.7 42.7  ?MCV 89.2  --  86.0 86.8  ?PLT 304  --  285 304  ? ? ? ?Basic Metabolic Panel: ?Recent Labs  ?Lab 08/24/21 ?1925 08/24/21 ?1942 08/25/21 ?0410 08/26/21 ?1191 08/27/21 ?4782  ?NA 139 139 139 136 136  ?K 3.1* 3.0* 3.8 3.1* 3.2*  ?CL 103 103 108 105 108  ?CO2 21*  --  19* 21* 23  ?GLUCOSE 130* 129* 115* 120* 114*  ?BUN 21 25* '21 15 18  '$ ?CREATININE 1.42* 1.10 1.02 0.98 1.02  ?CALCIUM 9.4  --  9.2 9.2 9.2  ?MG  --   --  1.9 1.8 1.8  ? ? ? ?Liver Function Tests: ?Recent Labs  ?Lab 08/24/21 ?1925 08/25/21 ?0410  ?AST 100* 93*  ?ALT 23 24  ?ALKPHOS 58 64  ?BILITOT 0.8 1.2  ?PROT 6.3* 6.5  ?ALBUMIN 3.5 3.4*  ? ? ? ? ?Radiology Studies: ?No results found. ? ? ? LOS: 3 days  ? ? ?Flora Lipps, MD ?[facility] ?08/27/2021, 11:24 AM  ?  ?

## 2021-08-27 NOTE — Progress Notes (Signed)
? ?Progress Note ? ?Patient Name: Gregory Lamb ?Date of Encounter: 08/27/2021 ? ?[facility] Cardiologist: None  ? ?Subjective  ? ?BP 129/75.  Oriented x1.  Denies pain. ? ?Inpatient Medications  ?  ?Scheduled Meds: ? aspirin EC  81 mg Oral Daily  ? clopidogrel  75 mg Oral Daily  ? donepezil  10 mg Oral Daily  ? enoxaparin (LOVENOX) injection  75 mg Subcutaneous BID  ? insulin aspart  0-6 Units Subcutaneous TID WC  ? losartan  25 mg Oral Daily  ? memantine  5 mg Oral BID  ? metoprolol tartrate  12.5 mg Oral BID  ? tamsulosin  0.4 mg Oral QPC supper  ? ?Continuous Infusions: ? potassium chloride 10 mEq (08/27/21 1018)  ? ?PRN Meds: ?acetaminophen **OR** acetaminophen, melatonin  ? ?Vital Signs  ?  ?Vitals:  ? 08/27/21 0036 08/27/21 0417 08/27/21 0437 08/27/21 1014  ?BP: (!) 141/70 (!) 155/77  111/76  ?Pulse: 69 64  78  ?Resp: '17 19  19  '$ ?Temp: (!) 97.5 ?F (36.4 ?C) 98.9 ?F (37.2 ?C)  98 ?F (36.7 ?C)  ?TempSrc: Oral Axillary  Axillary  ?SpO2: 97% 97%  96%  ?Weight:   72.4 kg   ?Height:      ? ? ?Intake/Output Summary (Last 24 hours) at 08/27/2021 1021 ?Last data filed at 08/27/2021 0429 ?Gross per 24 hour  ?Intake --  ?Output 400 ml  ?Net -400 ml  ? ? ? ?  08/27/2021  ?  4:37 AM 08/26/2021  ?  5:33 AM 08/24/2021  ?  7:08 PM  ?Last 3 Weights  ?Weight (lbs) 159 lb 9.8 oz 159 lb 2.8 oz 160 lb 15 oz  ?Weight (kg) 72.4 kg 72.2 kg 73 kg  ?   ? ?Telemetry  ?  ?Normal sinus rhythm- Personally Reviewed ? ?ECG  ?  ?No new ECG- Personally Reviewed ? ?Physical Exam  ? ?GEN: No acute distress.   ?Neck: No JVD ?Cardiac: RRR, no murmurs, rubs, or gallops.  ?Respiratory: Clear to auscultation bilaterally. ?GI: Soft, nontender ?MS: No edema; No deformity. ?Neuro: Oriented x1 ?Psych: Unable to assess ? ?Labs  ?  ?High Sensitivity Troponin:   ?Recent Labs  ?Lab 08/24/21 ?1925 08/24/21 ?2111 08/25/21 ?7858  ?TROPONINIHS [id number]* 85,027* [id number]*  ? ?   ?Chemistry ?Recent Labs  ?Lab 08/24/21 ?1925 08/24/21 ?1942 08/25/21 ?0410  08/26/21 ?7412 08/27/21 ?8786  ?NA 139   < > 139 136 136  ?K 3.1*   < > 3.8 3.1* 3.2*  ?CL 103   < > 108 105 108  ?CO2 21*  --  19* 21* 23  ?GLUCOSE 130*   < > 115* 120* 114*  ?BUN 21   < > '21 15 18  '$ ?CREATININE 1.42*   < > 1.02 0.98 1.02  ?CALCIUM 9.4  --  9.2 9.2 9.2  ?MG  --   --  1.9 1.8 1.8  ?PROT 6.3*  --  6.5  --   --   ?ALBUMIN 3.5  --  3.4*  --   --   ?AST 100*  --  93*  --   --   ?ALT 23  --  24  --   --   ?ALKPHOS 58  --  64  --   --   ?BILITOT 0.8  --  1.2  --   --   ?GFRNONAA 48*  --  >60 >60 >60  ?ANIONGAP 15  --  '12 10 5  '$ ? < > =  values in this interval not displayed.  ? ?  ?Lipids  ?Recent Labs  ?Lab 08/25/21 ?0086  ?CHOL 167  ?TRIG 59  ?HDL 40*  ?LDLCALC 115*  ?CHOLHDL 4.2  ? ?  ?Hematology ?Recent Labs  ?Lab 08/24/21 ?1925 08/24/21 ?1942 08/26/21 ?0248 08/27/21 ?7619  ?WBC 10.5  --  10.8* 8.6  ?RBC 5.17  --  4.85 4.92  ?HGB 15.5 16.0 14.6 14.7  ?HCT 46.1 47.0 41.7 42.7  ?MCV 89.2  --  86.0 86.8  ?MCH 30.0  --  30.1 29.9  ?MCHC 33.6  --  35.0 34.4  ?RDW 12.4  --  12.3 12.5  ?PLT 304  --  285 304  ? ? ?Thyroid  ?Recent Labs  ?Lab 08/25/21 ?1340  ?TSH 0.473  ? ?  ?BNP ?Recent Labs  ?Lab 08/24/21 ?1925  ?BNP 121.7*  ? ?  ?DDimer No results for input(s): DDIMER in the last 168 hours.  ? ?Radiology  ?  ?ECHOCARDIOGRAM COMPLETE ? ?Result Date: 08/25/2021 ?   ECHOCARDIOGRAM REPORT   Patient Name:   Gregory Lamb Date of Exam: 08/25/2021 Medical Rec #:  [id number]        Height:       68.0 in Accession #:    [id number]       Weight:       160.9 lb Date of Birth:  04/14/1934        BSA:          1.863 m? Patient Age:    49 years         BP:           159/82 mmHg Patient Gender: M                HR:           71 bpm. Exam Location:  Inpatient Procedure: 2D Echo and Intracardiac Opacification Agent Indications:    NSTEMI  History:        Patient has no prior history of Echocardiogram examinations.                 Risk Factors:Diabetes.  Sonographer:    Johny Chess RDCS Referring Phys: [id number] [place]  1. Left ventricular ejection fraction, by estimation, is 40 to 45%. The left ventricle has mildly decreased function. The left ventricle demonstrates regional wall motion abnormalities (see scoring diagram/findings for description). Left ventricular diastolic parameters were normal.  2. Right ventricular systolic function is normal. The right ventricular size is normal.  3. The mitral valve is grossly normal. Trivial mitral valve regurgitation. No evidence of mitral stenosis.  4. The aortic valve was not well visualized. There is mild calcification of the aortic valve. There is mild thickening of the aortic valve. Aortic valve regurgitation is not visualized. No aortic stenosis is present.  5. The inferior vena cava is normal in size with greater than 50% respiratory variability, suggesting right atrial pressure of 3 mmHg. Comparison(s): No prior Echocardiogram. Conclusion(s)/Recommendation(s): Focal wall motion abnormalities in the mid to apical septal walls and peri-apical portions of the anterior and lateral walls. There is a small adherent LV thrombus seen (see image 80) at the apical anteroseptum. Findings communicated with Dr. Louanne Belton and the cardiology rounding team. FINDINGS  Left Ventricle: Left ventricular ejection fraction, by estimation, is 40 to 45%. The left ventricle has mildly decreased function. The left ventricle demonstrates regional wall motion abnormalities. Definity contrast agent was given IV to delineate the left  ventricular endocardial borders. The left ventricular internal cavity size was normal in size. There is no left ventricular hypertrophy. Left ventricular diastolic parameters were normal.  LV Wall Scoring: The apical septal segment, apical inferior segment, and apex are akinetic. The mid anteroseptal segment, apical lateral segment, mid inferoseptal segment, apical anterior segment, and mid inferior segment are hypokinetic. The anterior wall, antero-lateral  wall, posterior wall, basal anteroseptal segment, basal inferior segment, and basal inferoseptal segment are normal. Right Ventricle: The right ventricular size is normal. No increase in right ventricular wall thickness. Right ventricular systolic function is normal. Left Atrium: Left atrial size was normal in size. Right Atrium: Right atrial size was normal in size. Pericardium: There is no evidence of pericardial effusion. Presence of epicardial fat layer. Mitral Valve: The mitral valve is grossly normal. There is mild thickening of the mitral valve leaflet(s). There is mild calcification of the mitral valve leaflet(s). Trivial mitral valve regurgitation. No evidence of mitral valve stenosis. Tricuspid Valve: The tricuspid valve is grossly normal. Tricuspid valve regurgitation is trivial. No evidence of tricuspid stenosis. Aortic Valve: The aortic valve was not well visualized. There is mild calcification of the aortic valve. There is mild thickening of the aortic valve. Aortic valve regurgitation is not visualized. No aortic stenosis is present. Pulmonic Valve: The pulmonic valve was not well visualized. Pulmonic valve regurgitation is not visualized. Aorta: The aortic root, ascending aorta and aortic arch are all structurally normal, with no evidence of dilitation or obstruction. Venous: The inferior vena cava is normal in size with greater than 50% respiratory variability, suggesting right atrial pressure of 3 mmHg. IAS/Shunts: The interatrial septum was not well visualized.  LEFT VENTRICLE PLAX 2D LVIDd:         4.00 cm   Diastology LVIDs:         2.90 cm   LV e' medial:    6.83 cm/s LV PW:         1.00 cm   LV E/e' medial:  9.7 LV IVS:        1.00 cm   LV e' lateral:   7.43 cm/s LVOT diam:     2.10 cm   LV E/e' lateral: 8.9 LV SV:         64 LV SV Index:   34 LVOT Area:     3.46 cm?  RIGHT VENTRICLE             IVC RV S prime:     12.20 cm/s  IVC diam: 1.20 cm TAPSE (M-mode): 1.3 cm LEFT ATRIUM              Index        RIGHT ATRIUM          Index LA diam:        3.80 cm 2.04 cm/m?   RA Area:     9.50 cm? LA Vol (A2C):   26.4 ml 14.17 ml/m?  RA Volume:   16.30 ml 8.75 ml/m? LA Vol (A4C):   26.7 ml 14.33 ml/m? LA Biplane Vol

## 2021-08-28 ENCOUNTER — Other Ambulatory Visit (HOSPITAL_COMMUNITY): Payer: Self-pay

## 2021-08-28 DIAGNOSIS — N400 Enlarged prostate without lower urinary tract symptoms: Secondary | ICD-10-CM | POA: Diagnosis not present

## 2021-08-28 DIAGNOSIS — G309 Alzheimer's disease, unspecified: Secondary | ICD-10-CM | POA: Diagnosis not present

## 2021-08-28 DIAGNOSIS — I25119 Atherosclerotic heart disease of native coronary artery with unspecified angina pectoris: Secondary | ICD-10-CM | POA: Diagnosis not present

## 2021-08-28 DIAGNOSIS — E119 Type 2 diabetes mellitus without complications: Secondary | ICD-10-CM | POA: Diagnosis not present

## 2021-08-28 LAB — BASIC METABOLIC PANEL
Anion gap: 9 (ref 5–15)
BUN: 19 mg/dL (ref 8–23)
CO2: 22 mmol/L (ref 22–32)
Calcium: 9.4 mg/dL (ref 8.9–10.3)
Chloride: 106 mmol/L (ref 98–111)
Creatinine, Ser: 1.02 mg/dL (ref 0.61–1.24)
GFR, Estimated: >60 mL/min (ref >60)
Glucose, Bld: 117 mg/dL — ABNORMAL HIGH (ref 70–99)
Potassium: 4 mmol/L (ref 3.5–5.1)
Sodium: 137 mmol/L (ref 135–145)

## 2021-08-28 LAB — CBC
HCT: 46.7 % (ref 39.0–52.0)
Hemoglobin: 15.7 g/dL (ref 13.0–17.0)
MCH: 29.5 pg (ref 26.0–34.0)
MCHC: 33.6 g/dL (ref 30.0–36.0)
MCV: 87.8 fL (ref 80.0–100.0)
Platelets: 282 10*3/uL (ref 150–400)
RBC: 5.32 MIL/uL (ref 4.22–5.81)
RDW: 12.5 % (ref 11.5–15.5)
WBC: 10.1 10*3/uL (ref 4.0–10.5)
nRBC: 0 % (ref 0.0–0.2)

## 2021-08-28 LAB — GLUCOSE, CAPILLARY
Glucose-Capillary: 113 mg/dL — ABNORMAL HIGH (ref 70–99)
Glucose-Capillary: 122 mg/dL — ABNORMAL HIGH (ref 70–99)
Glucose-Capillary: 213 mg/dL — ABNORMAL HIGH (ref 70–99)
Glucose-Capillary: 125 mg/dL — ABNORMAL HIGH (ref 70–99)

## 2021-08-28 MED ORDER — ROSUVASTATIN CALCIUM 5 MG PO TABS
10.0000 mg | ORAL_TABLET | Freq: Every day | ORAL | Status: DC
Start: 2021-08-28 — End: 2021-08-31
  Administered 2021-08-28 – 2021-08-31 (×4): 10 mg via ORAL
  Filled 2021-08-28 (×4): qty 2

## 2021-08-28 MED ORDER — APIXABAN 5 MG PO TABS
5.0000 mg | ORAL_TABLET | Freq: Two times a day (BID) | ORAL | Status: DC
  Administered 2021-08-28 – 2021-08-31 (×7): 5 mg via ORAL
  Filled 2021-08-28 (×7): qty 1

## 2021-08-28 MED ORDER — LOSARTAN POTASSIUM 50 MG PO TABS
50.0000 mg | ORAL_TABLET | Freq: Every day | ORAL | Status: DC
  Administered 2021-08-28 – 2021-08-31 (×4): 50 mg via ORAL
  Filled 2021-08-28 (×4): qty 1

## 2021-08-28 NOTE — Evaluation (Signed)
Physical Therapy Evaluation ?Patient Details ?Name: Gregory Lamb ?MRN: [id number] ?DOB: 05/16/1934 ?Today's Date: 08/28/2021 ? ?History of Present Illness ? Pt is an 88 y/o male admitteed secondary to AMS and fall. Pt found to have STEMI and LV thrombus. PMH includes dementia.  ?Clinical Impression ? Pt admitted secondary to problem above with deficits below. Pt requiring total A +2 for bed mobility tasks this session. Pt with cognitive deficits and poor command following. Spoke with pt's wife following session and she reports pt was ambulatory prior to admission. Recommending SNF level therapies at d/c to address current deficits. Will continue to follow acutely.    ?   ? ?Recommendations for follow up therapy are one component of a multi-disciplinary discharge planning process, led by the attending physician.  Recommendations may be updated based on patient status, additional functional criteria and insurance authorization. ? ?Follow Up Recommendations Skilled nursing-short term rehab (<3 hours/day) ? ?  ?Assistance Recommended at Discharge Frequent or constant Supervision/Assistance  ?Patient can return home with the following ? Two people to help with walking and/or transfers;Two people to help with bathing/dressing/bathroom;Assistance with cooking/housework;Direct supervision/assist for medications management;Direct supervision/assist for financial management;Assist for transportation;Help with stairs or ramp for entrance ? ?  ?Equipment Recommendations Wheelchair (measurements PT);Wheelchair cushion (measurements PT)  ?Recommendations for Other Services ?    ?  ?Functional Status Assessment Patient has had a recent decline in their functional status and demonstrates the ability to make significant improvements in function in a reasonable and predictable amount of time.  ? ?  ?Precautions / Restrictions Precautions ?Precautions: Fall ?Restrictions ?Weight Bearing Restrictions: No  ? ?  ? ?Mobility ? Bed  Mobility ?Overal bed mobility: Needs Assistance ?Bed Mobility: Supine to Sit, Sit to Supine, Rolling ?Rolling: Max assist ?  ?Supine to sit: Total assist, +2 for physical assistance ?Sit to supine: Total assist, +2 for physical assistance ?  ?General bed mobility comments: Required assist for trunk and LE assist. Pt with no initiation to perform bed mobility. Posterior lean in sitting and requiring max A. max A to roll for clean up ?  ? ?Transfers ?  ?  ?  ?  ?  ?  ?  ?  ?  ?  ?  ? ?Ambulation/Gait ?  ?  ?  ?  ?  ?  ?  ?  ? ?Stairs ?  ?  ?  ?  ?  ? ?Wheelchair Mobility ?  ? ?Modified Rankin (Stroke Patients Only) ?  ? ?  ? ?Balance Overall balance assessment: Needs assistance ?Sitting-balance support: No upper extremity supported ?Sitting balance-Leahy Scale: Poor ?Sitting balance - Comments: posterior lean and required max A for sitting balance ?  ?  ?  ?  ?  ?  ?  ?  ?  ?  ?  ?  ?  ?  ?  ?   ? ? ? ?Pertinent Vitals/Pain Pain Assessment ?Pain Assessment: Faces ?Faces Pain Scale: Hurts little more ?Pain Location: generalized ?Pain Descriptors / Indicators: Moaning ?Pain Intervention(s): Limited activity within patient's tolerance, Monitored during session, Repositioned  ? ? ?Home Living Family/patient expects to be discharged to:: Private residence ?Living Arrangements: Spouse/significant other ?Available Help at Discharge: Family;Available 24 hours/day ?Type of Home: House ?Home Access: Stairs to enter ?Entrance Stairs-Rails: Right;Left ?Entrance Stairs-Number of Steps: 2 ?  ?Home Layout: One level ?Home Equipment: Shower seat;Grab bars - tub/shower;Rolling Walker (2 wheels) ?   ?  ?Prior Function Prior Level of Function : Needs  assist ?  ?  ?  ?  ?  ?  ?Mobility Comments: Was ambulating without AD up until the last week and needed RW. ?ADLs Comments: Was able to bathe by himself. ?  ? ? ?Hand Dominance  ?   ? ?  ?Extremity/Trunk Assessment  ? Upper Extremity Assessment ?Upper Extremity Assessment: Defer to OT  evaluation ?  ? ?Lower Extremity Assessment ?Lower Extremity Assessment: Generalized weakness ?  ? ?Cervical / Trunk Assessment ?Cervical / Trunk Assessment: Kyphotic  ?Communication  ? Communication: HOH  ?Cognition Arousal/Alertness: Awake/alert ?Behavior During Therapy: Flat affect ?Overall Cognitive Status: No family/caregiver present to determine baseline cognitive functioning ?  ?  ?  ?  ?  ?  ?  ?  ?  ?  ?  ?  ?  ?  ?  ?  ?General Comments: Pt not answering many questions. Inconsistent with command following. ?  ?  ? ?  ?General Comments General comments (skin integrity, edema, etc.): Called wife after session and wife is concerned about taking pt home given current deficits. ? ?  ?Exercises    ? ?Assessment/Plan  ?  ?PT Assessment Patient needs continued PT services  ?PT Problem List Decreased strength;Decreased activity tolerance;Decreased balance;Decreased mobility;Decreased cognition;Decreased knowledge of use of DME;Decreased safety awareness;Decreased knowledge of precautions ? ?   ?  ?PT Treatment Interventions DME instruction;Gait training;Functional mobility training;Therapeutic activities;Therapeutic exercise;Balance training;Patient/family education;Cognitive remediation   ? ?PT Goals (Current goals can be found in the Care Plan section)  ?Acute Rehab PT Goals ?PT Goal Formulation: Patient unable to participate in goal setting ?Time For Goal Achievement: 09/11/21 ?Potential to Achieve Goals: Fair ? ?  ?Frequency Min 2X/week ?  ? ? ?Co-evaluation PT/OT/SLP Co-Evaluation/Treatment: Yes ?Reason for Co-Treatment: For patient/therapist safety ?PT goals addressed during session: Mobility/safety with mobility;Balance ?  ?  ? ? ?  ?AM-PAC PT "6 Clicks" Mobility  ?Outcome Measure Help needed turning from your back to your side while in a flat bed without using bedrails?: A Lot ?Help needed moving from lying on your back to sitting on the side of a flat bed without using bedrails?: Total ?Help needed moving  to and from a bed to a chair (including a wheelchair)?: Total ?Help needed standing up from a chair using your arms (e.g., wheelchair or bedside chair)?: Total ?Help needed to walk in hospital room?: Total ?Help needed climbing 3-5 steps with a railing? : Total ?6 Click Score: 7 ? ?  ?End of Session   ?Activity Tolerance: Patient tolerated treatment well ?Patient left: in bed;with call bell/phone within reach;with bed alarm set ?Nurse Communication: Mobility status ?PT Visit Diagnosis: Unsteadiness on feet (R26.81);Muscle weakness (generalized) (M62.81);Difficulty in walking, not elsewhere classified (R26.2) ?  ? ?Time: 2297-9892 ?PT Time Calculation (min) (ACUTE ONLY): 26 min ? ? ?Charges:   PT Evaluation ?$PT Eval Moderate Complexity: 1 Mod ?  ?  ?   ? ? ?Reuel Derby, PT, DPT  ?Acute Rehabilitation Services  ?Pager: [phone] ?Office: [phone] ? ? ?[place] ?08/28/2021, 10:23 AM ? ?

## 2021-08-28 NOTE — NC FL2 (Signed)
?[place] MEDICAID FL2 LEVEL OF CARE SCREENING TOOL  ?  ? ?IDENTIFICATION  ?Patient Name: ?Gregory Lamb Birthdate: 06-17-1933 Sex: male Admission Date (Current Location): ?08/24/2021  ?[place] and [place] Number: ? [place] ?  Facility and Address:  ?[facility], [place] [address] [place], [place] [address] ?     Provider Number: ?[id number]  ?Attending Physician Name and Address:  ?Flora Lipps, MD ? Relative Name and Phone Number:  ?[name] [phone] ?   ?Current Level of Care: ?Hospital Recommended Level of Care: ?[place] Prior Approval Number: ?  ? ?Date Approved/Denied: ?  PASRR Number: ?[id number] ? ?Discharge Plan: ?SNF ?  ? ?Current Diagnoses: ?Patient Active Problem List  ? Diagnosis Date Noted  ? Hypokalemia 08/25/2021  ? Lactic acidosis 08/25/2021  ? Advanced dementia 08/25/2021  ? Coronary artery disease involving native coronary artery of native heart with angina pectoris ([place]) 08/25/2021  ? LV (left ventricular) mural thrombus 08/25/2021  ? Ischemic cardiomyopathy 08/25/2021  ? BPH (benign prostatic hyperplasia)   ? DM2 (diabetes mellitus, type 2) ([place])   ? ST elevation myocardial infarction (STEMI) of anterior wall ([place]) 08/24/2021  ? Memory disorder 10/27/2019  ? Gait abnormality 10/27/2019  ? ? ?Orientation RESPIRATION BLADDER Height & Weight   ?  ?Self ? Normal External catheter, Incontinent (External Urinary Catheter) Weight: 160 lb 7.9 oz (72.8 kg) ?Height:  '5\' 8"'$  (172.7 cm)  ?BEHAVIORAL SYMPTOMS/MOOD NEUROLOGICAL BOWEL NUTRITION STATUS  ?    Continent (WDL) Diet (Please see discharge summary)  ?AMBULATORY STATUS COMMUNICATION OF NEEDS Skin   ?Extensive Assist Verbally Other (Comment) (appropriate for ethnicity,dry,abrasion,arm,right,ecchymosis,arm,right) ?  ?  ?  ?    ?     ?     ? ? ?Personal Care Assistance Level of Assistance  ?Bathing, Feeding, Dressing Bathing Assistance: Maximum assistance ?Feeding assistance: Limited assistance (Needs  assist,Needs assist sitting up) ?Dressing Assistance: Maximum assistance ?   ? ?Functional Limitations Info  ?Sight, Hearing, Speech Sight Info:  (WDL) ?Hearing Info:  (WDL) ?Speech Info: Adequate  ? ? ?SPECIAL CARE FACTORS FREQUENCY  ?PT (By licensed PT), OT (By licensed OT)   ?  ?PT Frequency: 5x min weekly ?OT Frequency: 5x min weekly ?  ?  ?  ?   ? ? ?Contractures Contractures Info: Not present  ? ? ?Additional Factors Info  ?Code Status, Allergies, Insulin Sliding Scale Code Status Info: DNR ?Allergies Info: No Known Allergies ?  ?Insulin Sliding Scale Info: insulin aspart (novoLOG) injection 0-6 Units 3 times daily with meals ?  ?   ? ?Current Medications (08/28/2021):  This is the current hospital active medication list ?Current Facility-Administered Medications  ?Medication Dose Route Frequency Provider Last Rate Last Admin  ? acetaminophen (TYLENOL) tablet 650 mg  650 mg Oral Q6H PRN Howerter, Justin B, DO   650 mg at 08/27/21 1433  ? Or  ? acetaminophen (TYLENOL) suppository 650 mg  650 mg Rectal Q6H PRN Howerter, Justin B, DO      ? apixaban (ELIQUIS) tablet 5 mg  5 mg Oral BID Reino Bellis B, NP   5 mg at 08/28/21 2043  ? clopidogrel (PLAVIX) tablet 75 mg  75 mg Oral Daily Pokhrel, Laxman, MD   75 mg at 08/28/21 1130  ? donepezil (ARICEPT) tablet 10 mg  10 mg Oral Daily Howerter, Justin B, DO   10 mg at 08/28/21 1130  ? insulin aspart (novoLOG) injection 0-6 Units  0-6 Units Subcutaneous TID WC Howerter,  Ethelda Chick, DO   2 Units at 08/28/21 1637  ? LORazepam (ATIVAN) tablet 0.5 mg  0.5 mg Oral Q6H PRN Pokhrel, Laxman, MD      ? losartan (COZAAR) tablet 50 mg  50 mg Oral Daily Reino Bellis B, NP   50 mg at 08/28/21 1130  ? melatonin tablet 3 mg  3 mg Oral PRN Howerter, Justin B, DO   3 mg at 08/28/21 2043  ? memantine (NAMENDA) tablet 5 mg  5 mg Oral BID Howerter, Justin B, DO   5 mg at 08/28/21 2043  ? metoprolol tartrate (LOPRESSOR) tablet 12.5 mg  12.5 mg Oral BID Donato Heinz, MD    12.5 mg at 08/28/21 2044  ? rosuvastatin (CRESTOR) tablet 10 mg  10 mg Oral Daily Reino Bellis B, NP   10 mg at 08/28/21 1130  ? tamsulosin (FLOMAX) capsule 0.4 mg  0.4 mg Oral QPC supper Howerter, Justin B, DO   0.4 mg at 08/27/21 1802  ? ? ? ?Discharge Medications: ?Please see discharge summary for a list of discharge medications. ? ?Relevant Imaging Results: ? ?Relevant Lab Results: ? ? ?Additional Information ?[phone] Covid Vaccines and 1 Booster ? ?Milas Gain, LCSWA ? ? ? ? ?

## 2021-08-28 NOTE — Progress Notes (Signed)
?PROGRESS NOTE ? ? ? ?DASHAWN BARTNICK  KPT:[id number] DOB: 1933-10-04 DOA: 08/24/2021 ?PCP: Janie Morning, DO  ? ? ?Brief Narrative:  ?Gregory Lamb is a 88 y.o. male with medical history significant for advanced dementia, type 2 diabetes mellitus, benign prostatic hyperplasia, presented to hospital after sustaining a fall unclear etiology.  In the ED, patient had normal blood pressure.  Mildly tachypneic.  There was mild hypokalemia with potassium of 3.1 and creatinine of 1.4.  Troponin I was elevated at 12500 with up to 16 600.  Lactate was elevated at 2.2.  CBC mildly elevated at 10.5 K.  EKG showed ST elevation in leads V3 through V6, all of which appear new relative to most recent prior EKG from July 2013.  Chest x-ray showed bibasilar opacities, concerning for atelectasis versus pneumonia.  Cardiology was consulted from the ED and the case was discussed with patient's daughter.  Plan was to proceed with medical management and patient was started on heparin drip. ?  ?Assessment and Plan: ? ?STEMI (ST elevation myocardial infarction) ([place]) ?Patient presented with a significantly elevated troponin with EKG changes suggestive of ST elevation MI.  Cardiology on board and likely LAD territory infarct.  Received therapeutic Lovenox due to LV thrombus transition to Eliquis at this time.  Continue metoprolol 12.5 twice daily and losartan daily for. No plans for invasive procedure.  Continue Plavix since the patient will be on Eliquis.  2D echocardiogram from 08/25/2021 showed LV ejection fraction of 40 to 45% with regional wall motion abnormality in the mid to apical septal walls with a small adherent LV thrombus.  Patient is unlikely to be good warfarin candidate. lipid panel noted with LDL of 115.  Magnesium 1.8. ?  ?DM2 (diabetes mellitus, type 2) ([place]) ?Continue sliding scale insulin.  Hemoglobin A1c of 6.2.  Latest POC glucose of 113 ?   ?BPH (benign prostatic hyperplasia) ?Continue tamsulosin. ?   ?Lactic  acidosis ?Elevated on presentation.  Trended down.  Was on metformin as outpatient but is on hold.  No signs of infection. Procalcitonin 0.10 ? ?Hypokalemia ?Improved after replacement.  Latest potassium of 4.0. ? ?Advanced dementia   Continue memantine. ?  ? DVT prophylaxis:    ?apixaban (ELIQUIS) tablet 5 mg  ? ?Code Status:   ?  Code Status: DNR ? ?Disposition:  ?Physical therapy has recommended skilled nursing facility placement at this time.    We will consult TOC spoke with the patient's daughter about it ? ?Status is: Inpatient ? ?Remains inpatient appropriate because: ST elevation MI on conservative treatment, LV thrombus on anticoagulation ? ? Family Communication:  ? I spoke with the patient's daughter on the phone  on 08/28/2021 updated her about the clinical condition of the patient. ? ?Consultants:  ?Cardiology ? ?Procedures:  ?None ? ?Antimicrobials:  ?None ? ?Anti-infectives (From admission, onward)  ? ? None  ? ?  ? ?Subjective: ?Today, patient was seen and examined at bedside.  Denies any pain.  Has advanced dementia.  Poor historian.  ? ?Objective: ?Vitals:  ? 08/27/21 2038 08/28/21 0534 08/28/21 0815 08/28/21 1130  ?BP: 140/72 (!) 170/81 138/87   ?Pulse: 60 65  72  ?Resp: '19 17 20   '$ ?Temp: 98.4 ?F (36.9 ?C) 99.6 ?F (37.6 ?C) 98.9 ?F (37.2 ?C)   ?TempSrc: Axillary Axillary Oral   ?SpO2: 90% 98% 96%   ?Weight:  72.8 kg    ?Height:      ? ? ?Intake/Output Summary (Last 24 hours) at 08/28/2021  1139 ?Last data filed at 08/28/2021 0544 ?Gross per 24 hour  ?Intake 543.67 ml  ?Output 450 ml  ?Net 93.67 ml  ? ? ?Filed Weights  ? 08/26/21 0533 08/27/21 0437 08/28/21 0534  ?Weight: 72.2 kg 72.4 kg 72.8 kg  ? ? ?Physical Examination: ? ?General:  Average built, not in obvious distress poor historian, dementia. ?HENT:   No scleral pallor or icterus noted. Oral mucosa is moist.  ?Chest:  Clear breath sounds.  Diminished breath sounds bilaterally. No crackles or wheezes.  ?CVS: S1 &S2 heard. No murmur.  Regular  rate and rhythm. ?Abdomen: Soft, nontender, nondistended.  Bowel sounds are heard.   ?Extremities: No cyanosis, clubbing or edema.  Peripheral pulses are palpable. ?Psych: Alert, awake, communicative, underlying dementia, oriented to self. ?CNS:  No cranial nerve deficits.  Moves extremities, underlying dementia, ?Skin: Warm and dry.  No rashes noted. ? ? ?Data Reviewed:  ? ?CBC: ?Recent Labs  ?Lab 08/24/21 ?1925 08/24/21 ?1942 08/26/21 ?8786 08/27/21 ?7672 08/28/21 ?0947  ?WBC 10.5  --  10.8* 8.6 10.1  ?NEUTROABS 7.2  --   --   --   --   ?HGB 15.5 16.0 14.6 14.7 15.7  ?HCT 46.1 47.0 41.7 42.7 46.7  ?MCV 89.2  --  86.0 86.8 87.8  ?PLT 304  --  285 304 282  ? ? ? ?Basic Metabolic Panel: ?Recent Labs  ?Lab 08/24/21 ?1925 08/24/21 ?1942 08/25/21 ?0410 08/26/21 ?0962 08/27/21 ?8366 08/28/21 ?0805  ?NA 139 139 139 136 136 137  ?K 3.1* 3.0* 3.8 3.1* 3.2* 4.0  ?CL 103 103 108 105 108 106  ?CO2 21*  --  19* 21* 23 22  ?GLUCOSE 130* 129* 115* 120* 114* 117*  ?BUN 21 25* '21 15 18 19  '$ ?CREATININE 1.42* 1.10 1.02 0.98 1.02 1.02  ?CALCIUM 9.4  --  9.2 9.2 9.2 9.4  ?MG  --   --  1.9 1.8 1.8  --   ? ? ? ?Liver Function Tests: ?Recent Labs  ?Lab 08/24/21 ?1925 08/25/21 ?0410  ?AST 100* 93*  ?ALT 23 24  ?ALKPHOS 58 64  ?BILITOT 0.8 1.2  ?PROT 6.3* 6.5  ?ALBUMIN 3.5 3.4*  ? ? ? ? ?Radiology Studies: ?No results found. ? ? ? LOS: 4 days  ? ? ?Flora Lipps, MD ?[facility] ?08/28/2021, 11:39 AM  ?  ?

## 2021-08-28 NOTE — TOC Benefit Eligibility Note (Signed)
Patient Advocate Encounter ? ?Insurance verification completed.   ? ?The patient is currently admitted and upon discharge could be taking Eliquis 5 mg. ? ?The current 30 day co-pay is, $312.00 due to a $265.00 deductible.  Once deductible is met will be a $47.00 deductible.  ? ?The patient is insured through [employer] Medicare Part D  ? ? ? ? , CPhT ?Pharmacy Patient Advocate Specialist ?[facility] Patient Advocate Team ?Direct Number: [phone]  Fax: [phone] ? ? ? ? ? ?  ?

## 2021-08-28 NOTE — Progress Notes (Addendum)
?The patient has been seen in conjunction with Harlan Stains, NP. All aspects of care have been considered and discussed. The patient has been personally interviewed, examined, and all clinical data has been reviewed. ? ?Agree with contents of note. ? ? ?Progress Note ? ?Patient Name: Gregory Lamb ?Date of Encounter: 08/28/2021 ? ?[facility] Cardiologist: Glenetta Hew, MD  ? ?Subjective  ? ?Resting comfortably in bed. Confused at baseline.  ? ?Inpatient Medications  ?  ?Scheduled Meds: ? aspirin EC  81 mg Oral Daily  ? clopidogrel  75 mg Oral Daily  ? donepezil  10 mg Oral Daily  ? enoxaparin (LOVENOX) injection  75 mg Subcutaneous BID  ? insulin aspart  0-6 Units Subcutaneous TID WC  ? losartan  25 mg Oral Daily  ? memantine  5 mg Oral BID  ? metoprolol tartrate  12.5 mg Oral BID  ? tamsulosin  0.4 mg Oral QPC supper  ? ?Continuous Infusions: ? ?PRN Meds: ?acetaminophen **OR** acetaminophen, LORazepam, melatonin  ? ?Vital Signs  ?  ?Vitals:  ? 08/27/21 1616 08/27/21 2038 08/28/21 0534 08/28/21 0815  ?BP: 117/66 140/72 (!) 170/81 138/87  ?Pulse: (!) 56 60 65   ?Resp: '19 19 17 20  '$ ?Temp: 98 ?F (36.7 ?C) 98.4 ?F (36.9 ?C) 99.6 ?F (37.6 ?C) 98.9 ?F (37.2 ?C)  ?TempSrc: Axillary Axillary Axillary Oral  ?SpO2: 97% 90% 98% 96%  ?Weight:   72.8 kg   ?Height:      ? ? ?Intake/Output Summary (Last 24 hours) at 08/28/2021 0843 ?Last data filed at 08/28/2021 0544 ?Gross per 24 hour  ?Intake 903.67 ml  ?Output 450 ml  ?Net 453.67 ml  ? ? ?  08/28/2021  ?  5:34 AM 08/27/2021  ?  4:37 AM 08/26/2021  ?  5:33 AM  ?Last 3 Weights  ?Weight (lbs) 160 lb 7.9 oz 159 lb 9.8 oz 159 lb 2.8 oz  ?Weight (kg) 72.8 kg 72.4 kg 72.2 kg  ?   ? ?Telemetry  ?  ?SR mostly 60s - Personally Reviewed ? ?ECG  ?  ?No new tracing ? ?Physical Exam  ? ?GEN: No acute distress.   ?Neck: No JVD ?Cardiac: RRR, no murmurs, rubs, or gallops.  ?Respiratory: Clear to auscultation bilaterally. ?GI: Soft, nontender, non-distended  ?MS: No edema; No  deformity. ?Neuro:  Nonfocal  ?Psych: confused at baseline ? ?Labs  ?  ?High Sensitivity Troponin:   ?Recent Labs  ?Lab 08/24/21 ?1925 08/24/21 ?2111 08/25/21 ?0867  ?TROPONINIHS [id number]* [id number]* 61,950*  ?   ?Chemistry ?Recent Labs  ?Lab 08/24/21 ?1925 08/24/21 ?1942 08/25/21 ?0410 08/26/21 ?9326 08/27/21 ?7124  ?NA 139   < > 139 136 136  ?K 3.1*   < > 3.8 3.1* 3.2*  ?CL 103   < > 108 105 108  ?CO2 21*  --  19* 21* 23  ?GLUCOSE 130*   < > 115* 120* 114*  ?BUN 21   < > '21 15 18  '$ ?CREATININE 1.42*   < > 1.02 0.98 1.02  ?CALCIUM 9.4  --  9.2 9.2 9.2  ?MG  --   --  1.9 1.8 1.8  ?PROT 6.3*  --  6.5  --   --   ?ALBUMIN 3.5  --  3.4*  --   --   ?AST 100*  --  93*  --   --   ?ALT 23  --  24  --   --   ?ALKPHOS 58  --  64  --   --   ?  BILITOT 0.8  --  1.2  --   --   ?GFRNONAA 48*  --  >60 >60 >60  ?ANIONGAP 15  --  '12 10 5  '$ ? < > = values in this interval not displayed.  ?  ?Lipids  ?Recent Labs  ?Lab 08/25/21 ?2440  ?CHOL 167  ?TRIG 59  ?HDL 40*  ?LDLCALC 115*  ?CHOLHDL 4.2  ?  ?Hematology ?Recent Labs  ?Lab 08/26/21 ?0248 08/27/21 ?0709 08/28/21 ?0650  ?WBC 10.8* 8.6 10.1  ?RBC 4.85 4.92 5.32  ?HGB 14.6 14.7 15.7  ?HCT 41.7 42.7 46.7  ?MCV 86.0 86.8 87.8  ?MCH 30.1 29.9 29.5  ?MCHC 35.0 34.4 33.6  ?RDW 12.3 12.5 12.5  ?PLT 285 304 282  ? ?Thyroid  ?Recent Labs  ?Lab 08/25/21 ?1340  ?TSH 0.473  ?  ?BNP ?Recent Labs  ?Lab 08/24/21 ?1925  ?BNP 121.7*  ?  ?DDimer No results for input(s): DDIMER in the last 168 hours.  ? ?Radiology  ?  ?No results found. ? ?Cardiac Studies  ? ?Echo 08/26/21: ? ? 1. Left ventricular ejection fraction, by estimation, is 40 to 45%. The  ?left ventricle has mildly decreased function. The left ventricle  ?demonstrates regional wall motion abnormalities (see scoring  ?diagram/findings for description). Left ventricular  ?diastolic parameters were normal.  ? 2. Right ventricular systolic function is normal. The right ventricular  ?size is normal.  ? 3. The mitral valve is grossly normal. Trivial mitral  valve  ?regurgitation. No evidence of mitral stenosis.  ? 4. The aortic valve was not well visualized. There is mild calcification  ?of the aortic valve. There is mild thickening of the aortic valve. Aortic  ?valve regurgitation is not visualized. No aortic stenosis is present.  ? 5. The inferior vena cava is normal in size with greater than 50%  ?respiratory variability, suggesting right atrial pressure of 3 mmHg.  ?  ?Conclusion(s)/Recommendation(s): Focal wall motion abnormalities in the  ?mid to apical septal walls and peri-apical portions of the anterior and  ?lateral walls. There is a small adherent LV thrombus seen (see image 80)  ?at the apical anteroseptum. Findings  ?communicated with Dr. Louanne Belton and the cardiology rounding team.  ? ?Patient Profile  ?   ?88 y.o. male  a history of diabetes mellitus, type II and potentially hypertension with advanced cognitive impairment/dementia and gait abnormality who presented with gait abnormality, falls and worsening disorientation.  He was found to have elevated troponins and EKG changes suggestive of anterior ST ovation MI of undetermined acuity.  He denied any symptoms, although he is relative for historian.  Based on family discussion, decision was made to treat medically with no plans for invasive evaluation. ?  ?Assessment & Plan  ?  ?STEMI/ACS: Initially presented with possible STEMI and LAD wall motion abnormality.  Given age and advanced dementia, decision was made not to take to the Cath Lab.  Managing medically. Initially treated with IV heparin and then transitioned to lovenox. Now completed, and need for [facility] ?-- will stop ASA, continue plavix, metoprolol 12.'5mg'$  BID, and losartan '25mg'$  daily. Add Crestor '10mg'$  daily ?  ?Ischemic cardiomyopathy: EF 40 to 45% following suspected anterior STEMI.   ?-- tolerating low dose metoprolol and losartan (will titrate from 25-'50mg'$  daily) ?-- remains volume stable ?  ?LV thrombus: Noted on echo. Was initially on IV  heparin, then switched to Lovenox. Planned to continue x72 hours then convert to oral anticoagulant, likely Eliquis.  ?-- Complicated situation given his  fall risk.  This was discussed with his family by Dr. Gardiner Rhyme and they are planning on going home with 24-hour supervision and they are comfortable with continuing anticoagulation for now  ?-- switch to Eliquis '5mg'$  BID today ? ?Hypokalemia: resolved, K+ 4.0 this morning ? ?Per primary ?Advanced Dementia ?DM ?Lactic acidosis ?BPH ? ?For questions or updates, please contact [facility] ?Please consult [email or web address] for contact info under  ? ?  ?   ?Signed, ?Reino Bellis, NP  ?08/28/2021, 8:43 AM   ? ?

## 2021-08-28 NOTE — Evaluation (Signed)
Occupational Therapy Evaluation ?Patient Details ?Name: Gregory Lamb ?MRN: [id number] ?DOB: March 01, 1934 ?Today's Date: 08/28/2021 ? ? ?History of Present Illness Pt is an 88 y/o male admitteed secondary to AMS and fall. Pt found to have STEMI and LV thrombus. PMH includes dementia.  ? ?Clinical Impression ?  ?Patient admitted for above and limited by problem list below, including weakness, impaired balance, decreased activity tolerance and cognition.  Spouse reports PTA patient able to walk and complete basic ADLs without assist.  Today, pt requires +2 total assist for bed mobility, total assist for ADLs (incontinent of bowel in bed).  Pt unable to follow commands and demonstrates poor awareness, mumbles throughout session.  Disoriented, unable to state his name but turns his head to "[name]".  Based on performance today, believe he will benefit from further OT services acutely and after dc at SNF Level to optimize return to PLOF with ADLs and mobility in order to decrease burden of care.  Will follow.   ? ?Recommendations for follow up therapy are one component of a multi-disciplinary discharge planning process, led by the attending physician.  Recommendations may be updated based on patient status, additional functional criteria and insurance authorization.  ? ?Follow Up Recommendations ? Skilled nursing-short term rehab (<3 hours/day)  ?  ?Assistance Recommended at Discharge Frequent or constant Supervision/Assistance  ?Patient can return home with the following Two people to help with walking and/or transfers;Two people to help with bathing/dressing/bathroom;Assistance with feeding;Direct supervision/assist for medications management;Assistance with cooking/housework;Assist for transportation;Direct supervision/assist for financial management;Help with stairs or ramp for entrance ? ?  ?Functional Status Assessment ? Patient has had a recent decline in their functional status and demonstrates the ability to  make significant improvements in function in a reasonable and predictable amount of time.  ?Equipment Recommendations ? Other (comment) (defer)  ?  ?Recommendations for Other Services Speech consult ? ? ?  ?Precautions / Restrictions Precautions ?Precautions: Fall ?Restrictions ?Weight Bearing Restrictions: No  ? ?  ? ?Mobility Bed Mobility ?Overal bed mobility: Needs Assistance ?Bed Mobility: Supine to Sit, Sit to Supine, Rolling ?Rolling: Max assist ?  ?Supine to sit: Total assist, +2 for physical assistance ?Sit to supine: Total assist, +2 for physical assistance ?  ?General bed mobility comments: Required assist for trunk and LE assist. Pt with no initiation to perform bed mobility. Posterior lean in sitting and requiring max A. max A to roll for clean up ?  ? ?Transfers ?  ?  ?  ?  ?  ?  ?  ?  ?  ?  ?  ? ?  ?Balance Overall balance assessment: Needs assistance ?Sitting-balance support: No upper extremity supported ?Sitting balance-Leahy Scale: Poor ?Sitting balance - Comments: posterior lean and required max A for sitting balance ?  ?  ?  ?  ?  ?  ?  ?  ?  ?  ?  ?  ?  ?  ?  ?   ? ?ADL either performed or assessed with clinical judgement  ? ?ADL Overall ADL's : Needs assistance/impaired ?  ?  ?  ?  ?  ?  ?  ?  ?  ?  ?  ?  ?  ?  ?  ?  ?  ?  ?Functional mobility during ADLs: Total assistance;+2 for physical assistance;+2 for safety/equipment ?General ADL Comments: total assist for all self care at this time.  Incontient of bowel in bed with total asisst + 2 for hygiene and sheet  change.  Pt not following commands to engage in basic grooming tasks.  ? ? ? ?Vision   ?   ?   ?Perception   ?  ?Praxis   ?  ? ?Pertinent Vitals/Pain Pain Assessment ?Pain Assessment: Faces ?Faces Pain Scale: Hurts little more ?Pain Location: generalized ?Pain Descriptors / Indicators: Moaning ?Pain Intervention(s): Limited activity within patient's tolerance, Monitored during session, Repositioned  ? ? ? ?Hand Dominance   ?   ?Extremity/Trunk Assessment Upper Extremity Assessment ?Upper Extremity Assessment: Generalized weakness ?  ?Lower Extremity Assessment ?Lower Extremity Assessment: Defer to PT evaluation ?  ?Cervical / Trunk Assessment ?Cervical / Trunk Assessment: Kyphotic ?  ?Communication Communication ?Communication: HOH ?  ?Cognition Arousal/Alertness: Awake/alert ?Behavior During Therapy: Flat affect ?Overall Cognitive Status: No family/caregiver present to determine baseline cognitive functioning ?  ?  ?  ?  ?  ?  ?  ?  ?  ?  ?  ?  ?  ?  ?  ?  ?General Comments: hx of dementia, pt mumbles and does not respond to questions.  Difficulty following commands. ?  ?  ?General Comments  PT called spouse after session for home setup and she is concerned for dc home ? ?  ?Exercises   ?  ?Shoulder Instructions    ? ? ?Home Living Family/patient expects to be discharged to:: Private residence ?Living Arrangements: Spouse/significant other ?Available Help at Discharge: Family;Available 24 hours/day ?Type of Home: House ?Home Access: Stairs to enter ?Entrance Stairs-Number of Steps: 2 ?Entrance Stairs-Rails: Right;Left ?Home Layout: One level ?  ?  ?Bathroom Shower/Tub: Walk-in shower ?  ?Bathroom Toilet: Standard ?  ?  ?Home Equipment: Shower seat;Grab bars - tub/shower;Rolling Walker (2 wheels) ?  ?  ?  ? ?  ?Prior Functioning/Environment Prior Level of Function : Needs assist ?  ?  ?  ?  ?  ?  ?Mobility Comments: Was ambulating without AD up until the last week and needed RW. ?ADLs Comments: Was able to bathe by himself. ?  ? ?  ?  ?OT Problem List: Decreased strength;Decreased activity tolerance;Impaired balance (sitting and/or standing);Decreased safety awareness;Decreased knowledge of use of DME or AE;Decreased knowledge of precautions ?  ?   ?OT Treatment/Interventions: Self-care/ADL training;DME and/or AE instruction;Therapeutic activities;Therapeutic exercise;Patient/family education;Balance training  ?  ?OT Goals(Current goals  can be found in the care plan section) Acute Rehab OT Goals ?Patient Stated Goal: none stated ?OT Goal Formulation: Patient unable to participate in goal setting ?Time For Goal Achievement: 09/11/21 ?Potential to Achieve Goals: Good  ?OT Frequency: Min 2X/week ?  ? ?Co-evaluation PT/OT/SLP Co-Evaluation/Treatment: Yes ?Reason for Co-Treatment: For patient/therapist safety;To address functional/ADL transfers ?PT goals addressed during session: Mobility/safety with mobility;Balance ?OT goals addressed during session: ADL's and self-care ?  ? ?  ?AM-PAC OT "6 Clicks" Daily Activity     ?Outcome Measure Help from another person eating meals?: Total ?Help from another person taking care of personal grooming?: Total ?Help from another person toileting, which includes using toliet, bedpan, or urinal?: Total ?Help from another person bathing (including washing, rinsing, drying)?: Total ?Help from another person to put on and taking off regular upper body clothing?: Total ?Help from another person to put on and taking off regular lower body clothing?: Total ?6 Click Score: 6 ?  ?End of Session Nurse Communication: Mobility status ? ?Activity Tolerance: Patient tolerated treatment well ?Patient left: in bed;with call bell/phone within reach;with bed alarm set ? ?OT Visit Diagnosis: Other abnormalities of gait and  mobility (R26.89);Muscle weakness (generalized) (M62.81);Other symptoms and signs involving cognitive function  ?              ?Time: 7972-8206 ?OT Time Calculation (min): 26 min ?Charges:  OT General Charges ?$OT Visit: 1 Visit ?OT Evaluation ?$OT Eval Moderate Complexity: 1 Mod ? ?Jolaine Artist, OT ?Acute Rehabilitation Services ?Pager [phone] ?Office [phone] ? ? ?Delight Stare ?08/28/2021, 11:02 AM ?

## 2021-08-28 NOTE — Care Management Important Message (Signed)
Important Message ? ?Patient Details  ?Name: Gregory Lamb ?MRN: [id number] ?Date of Birth: 1933-08-31 ? ? ?Medicare Important Message Given:  Yes ? ? ? ? ?Levada Dy  Nolita Kutter-Martin ?08/28/2021, 3:24 PM ?

## 2021-08-28 NOTE — Progress Notes (Signed)
ANTICOAGULATION CONSULT NOTE  ? ?Pharmacy Consult for heparin>>Lovenox > Eliquis ?Indication: chest pain/ACS + LV thrombus ? ?No Known Allergies ? ?Patient Measurements: ?Height: '5\' 8"'$  (172.7 cm) ?Weight: 72.8 kg (160 lb 7.9 oz) ?IBW/kg (Calculated) : 68.4 ?Heparin Dosing Weight: 73 kg ? ?Vital Signs: ?Temp: 98.9 ?F (37.2 ?C) (03/27 0815) ?Temp Source: Oral (03/27 0815) ?BP: 138/87 (03/27 0815) ?Pulse Rate: 65 (03/27 0534) ? ?Labs: ?Recent Labs  ?  08/25/21 ?1627 08/26/21 ?0248 08/26/21 ?0248 08/27/21 ?0709 08/28/21 ?0650 08/28/21 ?0805  ?HGB  --  14.6   < > 14.7 15.7  --   ?HCT  --  41.7  --  42.7 46.7  --   ?PLT  --  285  --  304 282  --   ?HEPARINUNFRC 0.26*  --   --   --   --   --   ?CREATININE  --  0.98  --  1.02  --  1.02  ? < > = values in this interval not displayed.  ? ? ? ?Estimated Creatinine Clearance: 50.3 mL/min (by C-G formula based on SCr of 1.02 mg/dL). ? ? ?Medical History: ?Past Medical History:  ?Diagnosis Date  ? BPH (benign prostatic hypertrophy)   ? Cataract   ? bil catarCTS REMOVED  ? Colon polyps   ? Diabetes mellitus   ? Gait abnormality 10/27/2019  ? Hernia   ? abdominal/ventral  ? Memory disorder 10/27/2019  ? Memory loss   ? Sleep apnea   ? last study 5 years ago- per patient SEVERE.  wears c_pap  ? ? ?Medications:  ? ?Scheduled:  ? apixaban  5 mg Oral BID  ? clopidogrel  75 mg Oral Daily  ? donepezil  10 mg Oral Daily  ? insulin aspart  0-6 Units Subcutaneous TID WC  ? losartan  50 mg Oral Daily  ? memantine  5 mg Oral BID  ? metoprolol tartrate  12.5 mg Oral BID  ? rosuvastatin  10 mg Oral Daily  ? tamsulosin  0.4 mg Oral QPC supper  ? ? ?Assessment: ?Patient presented after multiple fall. Pharmacy consulted to start heparin. Troponin is 12,524, lactic acid is 2.2, vitals and CBC are stable. F/u plans for anticoagulation during ACS work-up.  Transitioned to Lovenox 3/24. ? ?Patient not taking anticoagulation PTA.  LV thrombus seen on ECHO. ? ?Pharmacy asked to transition to Eliquis today  in preparation of discharge.   ? ?Goal of Therapy:  ?Anti-Xa 0.6-1 ?Monitor platelets by anticoagulation protocol: Yes ?  ?Plan:  ?Stop Lovenox. ?Start Eliquis 5 mg po BID. ?Will complete Eliquis education with family prior to discharge. ? ?[place]ada Crane, Pharm D, BCPS, BCCP ?Clinical Pharmacist ? 08/28/2021 9:05 AM  ? ?[facility] pharmacy phone numbers are listed on [email or web address] ? ? ? ? ? ? ?

## 2021-08-28 NOTE — TOC Initial Note (Signed)
Transition of Care (TOC) - Initial/Assessment Note  ? ? ?Patient Details  ?Name: Gregory Lamb ?MRN: [id number] ?Date of Birth: Mar 03, 1934 ? ?Transition of Care (TOC) CM/SW Contact:    ?Milas Gain, LCSWA ?Phone Number: ?08/28/2021, 4:48 PM ? ?Clinical Narrative:                 ? ?CSW received consult for possible SNF placement at time of discharge.Due to patients current orientation CSW spoke with patients daughter Gregory Lamb regarding PT recommendation of SNF placement at time of discharge. Patients daughter reports patient comes from home with spouse. Patients daughter reports that patient's spouse is currently unable to care for patient at their home given patient?s current physical needs and fall risk. Patients daughter expressed understanding of PT recommendation and is agreeable to SNF placement for patient at time of discharge. Patients daughter gave CSW permission to fax out initial referral near the [place] area. CSW discussed insurance authorization process with patients daughter.Patients daughter reports patient  has received the COVID vaccines as well as 1 booster.  No further questions reported at this time. CSW to continue to follow and assist with discharge planning needs.  ? ? ? ?Expected Discharge Plan: [place] ?Barriers to Discharge: Continued Medical Work up ? ? ?Patient Goals and CMS Choice ?  ?CMS Medicare.gov Compare Post Acute Care list provided to:: Patient Represenative (must comment) (spoke with daughter Gregory Lamb) ?Choice offered to / list presented to : Adult Children (Spoke with daughter Gregory Lamb) ? ?Expected Discharge Plan and Services ?Expected Discharge Plan: [place] ?In-house Referral: Clinical Social Work ?  ?  ?Living arrangements for the past 2 months: [place] ?                ?  ?  ?  ?  ?  ?  ?  ?  ?  ?  ? ?Prior Living Arrangements/Services ?Living arrangements for the past 2 months: [place] ?Lives with:: Self, Spouse ?Patient  language and need for interpreter reviewed:: Yes ?Do you feel safe going back to the place where you live?: No   SNF  ?Need for Family Participation in Patient Care: Yes (Comment) ?Care giver support system in place?: Yes (comment) ?  ?Criminal Activity/Legal Involvement Pertinent to Current Situation/Hospitalization: No - Comment as needed ? ?Activities of Daily Living ?  ?  ? ?Permission Sought/Granted ?Permission sought to share information with : Case Manager, Family Supports, Customer service manager ?Permission granted to share information with : No ? Share Information with NAME: Patient only oriented to person CSW spoke with patients daughter Gregory Lamb ? Permission granted to share info w AGENCY: Patient only oriented to person CSW spoke with patients daughter Gina/SNF ? Permission granted to share info w Relationship: Patient only oriented to person CSW spoke with patients daughter Gina/daughter ? Permission granted to share info w Contact Information: Patient only oriented to person CSW spoke with patients daughter Gregory Lamb [phone] ? ?Emotional Assessment ?  ?  ?  ?Orientation: : Oriented to Self ?Alcohol / Substance Use: Not Applicable ?Psych Involvement: No (comment) ? ?Admission diagnosis:  NSTEMI (non-ST elevated myocardial infarction) ([place]) [I21.4] ?Patient Active Problem List  ? Diagnosis Date Noted  ? Hypokalemia 08/25/2021  ? Lactic acidosis 08/25/2021  ? Advanced dementia 08/25/2021  ? Coronary artery disease involving native coronary artery of native heart with angina pectoris ([place]) 08/25/2021  ? LV (left ventricular) mural thrombus 08/25/2021  ? Ischemic cardiomyopathy 08/25/2021  ? BPH (benign prostatic  hyperplasia)   ? DM2 (diabetes mellitus, type 2) ([place])   ? ST elevation myocardial infarction (STEMI) of anterior wall ([place]) 08/24/2021  ? Memory disorder 10/27/2019  ? Gait abnormality 10/27/2019  ? ?PCP:  Janie Morning, DO ?Pharmacy:   ?[id number] [place] [facility] [address]?Phone: [phone]: [phone]? ? ? ? ?Social Determinants of Health (SDOH) Interventions ?  ? ?Readmission Risk Interventions ?   ? View : No data to display.  ?  ?  ?  ? ? ? ?

## 2021-08-28 NOTE — Plan of Care (Signed)

## 2021-08-29 DIAGNOSIS — I2109 ST elevation (STEMI) myocardial infarction involving other coronary artery of anterior wall: Secondary | ICD-10-CM | POA: Diagnosis not present

## 2021-08-29 DIAGNOSIS — N400 Enlarged prostate without lower urinary tract symptoms: Secondary | ICD-10-CM | POA: Diagnosis not present

## 2021-08-29 DIAGNOSIS — I513 Intracardiac thrombosis, not elsewhere classified: Secondary | ICD-10-CM

## 2021-08-29 DIAGNOSIS — I25119 Atherosclerotic heart disease of native coronary artery with unspecified angina pectoris: Secondary | ICD-10-CM | POA: Diagnosis not present

## 2021-08-29 DIAGNOSIS — G309 Alzheimer's disease, unspecified: Secondary | ICD-10-CM | POA: Diagnosis not present

## 2021-08-29 LAB — GLUCOSE, CAPILLARY
Glucose-Capillary: 125 mg/dL — ABNORMAL HIGH (ref 70–99)
Glucose-Capillary: 131 mg/dL — ABNORMAL HIGH (ref 70–99)
Glucose-Capillary: 119 mg/dL — ABNORMAL HIGH (ref 70–99)
Glucose-Capillary: 144 mg/dL — ABNORMAL HIGH (ref 70–99)

## 2021-08-29 LAB — BASIC METABOLIC PANEL
Anion gap: 11 (ref 5–15)
BUN: 17 mg/dL (ref 8–23)
CO2: 23 mmol/L (ref 22–32)
Calcium: 9.4 mg/dL (ref 8.9–10.3)
Chloride: 103 mmol/L (ref 98–111)
Creatinine, Ser: 1.02 mg/dL (ref 0.61–1.24)
GFR, Estimated: >60 mL/min (ref >60)
Glucose, Bld: 122 mg/dL — ABNORMAL HIGH (ref 70–99)
Potassium: 3.9 mmol/L (ref 3.5–5.1)
Sodium: 137 mmol/L (ref 135–145)

## 2021-08-29 LAB — CBC
HCT: 47.2 % (ref 39.0–52.0)
Hemoglobin: 16.1 g/dL (ref 13.0–17.0)
MCH: 29.5 pg (ref 26.0–34.0)
MCHC: 34.1 g/dL (ref 30.0–36.0)
MCV: 86.6 fL (ref 80.0–100.0)
Platelets: 369 10*3/uL (ref 150–400)
RBC: 5.45 MIL/uL (ref 4.22–5.81)
RDW: 12.6 % (ref 11.5–15.5)
WBC: 10.6 10*3/uL — ABNORMAL HIGH (ref 4.0–10.5)
nRBC: 0 % (ref 0.0–0.2)

## 2021-08-29 LAB — MAGNESIUM: Magnesium: 2 mg/dL (ref 1.7–2.4)

## 2021-08-29 NOTE — TOC Progression Note (Addendum)
Transition of Care (TOC) - Progression Note  ? ? ?Patient Details  ?Name: CHRISHUN SCHEER ?MRN: [id number] ?Date of Birth: [birth year] ? ?Transition of Care (TOC) CM/SW Contact  ?Milas Gain, LCSWA ?Phone Number: ?08/29/2021, 11:13 AM ? ?Clinical Narrative:    ? ? ?Update- Patients daughter [name] chose SNF placement at [facility]. Juliann Pulse with [facility] started insurance authorization for patient. Juliann Pulse confirmed no covid needed when patient is medically ready to dc over. Patients insurance authorization currently pending.CSW will continue to follow. ? ? ?Update 11:50m CSW received call back from patients daughter GBarnett Applebaumand provided SNF bed offers. GBarnett Applebaumwants to go over offers with her sister patients other daughter and will give CSW a call back shortly. CSW awaiting callback. ? ?CSW tried to called patients daughter GBarnett Applebaumto discuss patients SNF bed offers. CSW LVM. CSW awaiting callback. Patient has regular [employer]. Facility of choice will start insurance auth for patient.CSW will continue to follow and assist with patients dc planning needs. ? ? ?Expected Discharge Plan: [place]?Barriers to Discharge: Continued Medical Work up ? ?Expected Discharge Plan and Services ?Expected Discharge Plan: [place]?In-house Referral: Clinical Social Work ?  ?  ?Living [place] for the past 2 months: [place]?                ?  ?  ?  ?  ?  ?  ?  ?  ?  ?  ? ? ?Social Determinants of Health (SDOH) Interventions ?  ? ?Readmission Risk Interventions ?   ? View : No data to display.  ?  ?  ?  ? ? ?

## 2021-08-29 NOTE — Progress Notes (Addendum)
? ?The patient has been seen in conjunction with Harlan Stains, NP. All aspects of care have been considered and discussed. The patient has been personally interviewed, examined, and all clinical data has been reviewed. ? ?Agree with note. ?Ready for discharge to skilled nursing facility. ? ? ?Progress Note ? ?Patient Name: Gregory Lamb ?Date of Encounter: 08/29/2021 ? ?[facility] Cardiologist: Glenetta Hew, MD  ? ?Subjective  ? ?Sitting up in bed. Smiling, pleasantly confused ? ?Inpatient Medications  ?  ?Scheduled Meds: ? apixaban  5 mg Oral BID  ? clopidogrel  75 mg Oral Daily  ? donepezil  10 mg Oral Daily  ? insulin aspart  0-6 Units Subcutaneous TID WC  ? losartan  50 mg Oral Daily  ? memantine  5 mg Oral BID  ? metoprolol tartrate  12.5 mg Oral BID  ? rosuvastatin  10 mg Oral Daily  ? tamsulosin  0.4 mg Oral QPC supper  ? ?Continuous Infusions: ? ?PRN Meds: ?acetaminophen **OR** acetaminophen, LORazepam, melatonin  ? ?Vital Signs  ?  ?Vitals:  ? 08/28/21 0815 08/28/21 1130 08/28/21 2023 08/29/21 0455  ?BP: 138/87  138/88 136/76  ?Pulse:  72 77 (!) 58  ?Resp: '20  20 17  '$ ?Temp: 98.9 ?F (37.2 ?C)  98.6 ?F (37 ?C) (!) 97.4 ?F (36.3 ?C)  ?TempSrc: Oral  Axillary Axillary  ?SpO2: 96%     ?Weight:      ?Height:      ? ? ?Intake/Output Summary (Last 24 hours) at 08/29/2021 0848 ?Last data filed at 08/28/2021 1100 ?Gross per 24 hour  ?Intake 30 ml  ?Output --  ?Net 30 ml  ? ? ?  08/28/2021  ?  5:34 AM 08/27/2021  ?  4:37 AM 08/26/2021  ?  5:33 AM  ?Last 3 Weights  ?Weight (lbs) 160 lb 7.9 oz 159 lb 9.8 oz 159 lb 2.8 oz  ?Weight (kg) 72.8 kg 72.4 kg 72.2 kg  ?   ? ?Telemetry  ?  ?SR, 60s - Personally Reviewed ? ?ECG  ?  ?No new tracing ? ?Physical Exam  ? ?GEN: No acute distress.   ?Neck: No JVD ?Cardiac: RRR, no murmurs, rubs, or gallops.  ?Respiratory: Clear to auscultation bilaterally. ?GI: Soft, nontender, non-distended  ?MS: No edema; No deformity. ?Neuro:  Nonfocal  ?Psych: confused at baseline ? ?Labs  ?   ?High Sensitivity Troponin:   ?Recent Labs  ?Lab 08/24/21 ?1925 08/24/21 ?2111 08/25/21 ?0626  ?TROPONINIHS [id number]* [id number]* 94,854*  ?   ?Chemistry ?Recent Labs  ?Lab 08/24/21 ?1925 08/24/21 ?1942 08/25/21 ?6270 08/26/21 ?3500 08/27/21 ?9381 08/28/21 ?8299 08/29/21 ?3716  ?NA 139   < > 139 136 136 137 137  ?K 3.1*   < > 3.8 3.1* 3.2* 4.0 3.9  ?CL 103   < > 108 105 108 106 103  ?CO2 21*  --  19* 21* '23 22 23  '$ ?GLUCOSE 130*   < > 115* 120* 114* 117* 122*  ?BUN 21   < > '21 15 18 19 17  '$ ?CREATININE 1.42*   < > 1.02 0.98 1.02 1.02 1.02  ?CALCIUM 9.4  --  9.2 9.2 9.2 9.4 9.4  ?MG  --    < > 1.9 1.8 1.8  --  2.0  ?PROT 6.3*  --  6.5  --   --   --   --   ?ALBUMIN 3.5  --  3.4*  --   --   --   --   ?AST  100*  --  93*  --   --   --   --   ?ALT 23  --  24  --   --   --   --   ?ALKPHOS 58  --  64  --   --   --   --   ?BILITOT 0.8  --  1.2  --   --   --   --   ?GFRNONAA 48*  --  >60 >60 >60 >60 >60  ?ANIONGAP 15  --  '12 10 5 9 11  '$ ? < > = values in this interval not displayed.  ?  ?Lipids  ?Recent Labs  ?Lab 08/25/21 ?4034  ?CHOL 167  ?TRIG 59  ?HDL 40*  ?LDLCALC 115*  ?CHOLHDL 4.2  ?  ?Hematology ?Recent Labs  ?Lab 08/27/21 ?0709 08/28/21 ?0650 08/29/21 ?0336  ?WBC 8.6 10.1 10.6*  ?RBC 4.92 5.32 5.45  ?HGB 14.7 15.7 16.1  ?HCT 42.7 46.7 47.2  ?MCV 86.8 87.8 86.6  ?MCH 29.9 29.5 29.5  ?MCHC 34.4 33.6 34.1  ?RDW 12.5 12.5 12.6  ?PLT 304 282 369  ? ?Thyroid  ?Recent Labs  ?Lab 08/25/21 ?1340  ?TSH 0.473  ?  ?BNP ?Recent Labs  ?Lab 08/24/21 ?1925  ?BNP 121.7*  ?  ?DDimer No results for input(s): DDIMER in the last 168 hours.  ? ?Radiology  ?  ?No results found. ? ?Cardiac Studies  ? ?Echo 08/26/21: ?  ? 1. Left ventricular ejection fraction, by estimation, is 40 to 45%. The  ?left ventricle has mildly decreased function. The left ventricle  ?demonstrates regional wall motion abnormalities (see scoring  ?diagram/findings for description). Left ventricular  ?diastolic parameters were normal.  ? 2. Right ventricular systolic function  is normal. The right ventricular  ?size is normal.  ? 3. The mitral valve is grossly normal. Trivial mitral valve  ?regurgitation. No evidence of mitral stenosis.  ? 4. The aortic valve was not well visualized. There is mild calcification  ?of the aortic valve. There is mild thickening of the aortic valve. Aortic  ?valve regurgitation is not visualized. No aortic stenosis is present.  ? 5. The inferior vena cava is normal in size with greater than 50%  ?respiratory variability, suggesting right atrial pressure of 3 mmHg.  ?  ?Conclusion(s)/Recommendation(s): Focal wall motion abnormalities in the  ?mid to apical septal walls and peri-apical portions of the anterior and  ?lateral walls. There is a small adherent LV thrombus seen (see image 80)  ?at the apical anteroseptum. Findings  ?communicated with Dr. Louanne Belton and the cardiology rounding team.  ? ?Patient Profile  ?   ?88 y.o. male with a history of diabetes mellitus, type II and potentially hypertension with advanced cognitive impairment/dementia and gait abnormality who presented with gait abnormality, falls and worsening disorientation.  He was found to have elevated troponins and EKG changes suggestive of anterior ST ovation MI of undetermined acuity. Based on family discussion, decision was made to treat medically with no plans for invasive evaluation. ? ?Assessment & Plan  ?  ?STEMI/ACS: Initially presented with possible STEMI and LAD wall motion abnormality.  Given age and advanced dementia, decision was made not to take to the Cath Lab.  Managing medically. Initially treated with IV heparin and then transitioned to lovenox. Now completed, and need for [facility] ?-- ASA stopped, continue plavix, metoprolol 12.'5mg'$  BID, losartan '25mg'$  daily and Crestor '10mg'$  daily ?  ?Ischemic cardiomyopathy: EF 40 to 45% following suspected anterior STEMI.   ?--  tolerating low dose metoprolol and losartan '25mg'$  daily ?-- remains volume stable ?  ?LV thrombus: Noted on echo. Was  initially on IV heparin, then switched to Lovenox treated for 72 hours.   ?-- Complicated situation given his fall risk.  This was discussed with his family by Dr. Gardiner Rhyme and they are planning on going home with 24-hour supervision/SNF and they are comfortable with continuing anticoagulation for now.  ?-- continue Eliquis '5mg'$  BID ?  ?Hypokalemia: resolved, K+ 3.9  ?  ?Per primary ?Advanced Dementia ?DM ?Lactic acidosis ?BPH ? ?Family planning for SNF at discharge  ? ?For questions or updates, please contact [place] ?Please consult [email or web address] for contact info under  ? ?  ?   ?Signed, ?Reino Bellis, NP  ?08/29/2021, 8:48 AM   ? ?

## 2021-08-29 NOTE — Progress Notes (Signed)
?PROGRESS NOTE ? ? ? ?Gregory Lamb  JJK:[id number] DOB: 1933/08/23 DOA: 08/24/2021 ?PCP: Janie Morning, DO  ? ? ?Brief Narrative:  ?Gregory Lamb is a 88 y.o. male with medical history significant for advanced dementia, type 2 diabetes mellitus, benign prostatic hyperplasia, presented to the hospital after sustaining a fall of unclear etiology.  In the ED, patient had normal blood pressure.  Mildly tachypneic.  There was mild hypokalemia with potassium of 3.1 and creatinine of 1.4.  Troponin I was elevated at 12500 with uptrend to 16600.  Lactate was elevated at 2.2.  CBC mildly elevated at 10.5 K.  EKG showed ST elevation in leads V3 through V6, all of which appear new relative to most recent prior EKG from July 2013.  Chest x-ray showed bibasilar opacities, concerning for atelectasis versus pneumonia.  Cardiology was consulted from the ED and the case was discussed with patient's daughter due to ST elevation MI.Marland Kitchen  Plan was to proceed with medical management and patient was started on heparin drip.   ? ?At this time, patient has completed heparin drip and is being medically managed.  Stable for disposition to skilled nursing facility. ?  ?Assessment and Plan: ? ?STEMI (ST elevation myocardial infarction) ([place]) ?Patient presented with a significantly elevated troponin with EKG changes suggestive of ST elevation MI-LAD territory infarct.  Received therapeutic Lovenox initially and due to LV thrombus transition to Eliquis at this time.  Continue metoprolol, Plavix and losartan daily. No plans for invasive procedure.  Continue Plavix since the patient will be on Eliquis.  2D echocardiogram from 08/25/2021 showed LV ejection fraction of 40 to 45% with regional wall motion abnormality in the mid to apical septal walls with a small adherent LV thrombus.  Patient is unlikely to be good warfarin candidate. lipid panel noted with LDL of 115.  Latest magnesium level of 2.0. ?  ?DM2 (diabetes mellitus, type 2)  ([place]) ?Continue sliding scale insulin.  Hemoglobin A1c of 6.2.  Latest POC glucose of 125 ?   ?BPH (benign prostatic hyperplasia) ?Continue tamsulosin. ?   ?Lactic acidosis ?Elevated on presentation.  Trended down.  Was on metformin as outpatient, on hold.  No signs of infection. Procalcitonin 0.10 ? ?Hypokalemia ?Improved after replacement.  Latest potassium of 3.9. ? ?Advanced dementia   Continue memantine. ?  ? DVT prophylaxis:    ?apixaban (ELIQUIS) tablet 5 mg  ? ?Code Status:   ?  Code Status: DNR ? ?Disposition:  ?Physical therapy has recommended skilled nursing facility placement at this time.   Medically stable for disposition when skilled nursing facility is available. ? ?Status is: Inpatient ? ?Remains inpatient appropriate because: ST elevation MI on conservative treatment, LV thrombus on anticoagulation ? ? Family Communication:  ?  spoke with the patient's daughter on the phone  on 08/28/2021 and updated her about the clinical condition of the patient. ? ?Consultants:  ?Cardiology ? ?Procedures:  ?None ? ?Antimicrobials:  ?None ? ?Anti-infectives (From admission, onward)  ? ? None  ? ?  ? ?Subjective: ?Today, patient was seen and examined at bedside.  Patient denies any pain, nausea or vomiting.  Poor historian due to underlying dementia. ? ?Objective: ?Vitals:  ? 08/28/21 1130 08/28/21 2023 08/29/21 0455 08/29/21 0845  ?BP:  138/88 136/76 136/78  ?Pulse: 72 77 (!) 58 65  ?Resp:  '20 17 18  '$ ?Temp:  98.6 ?F (37 ?C) (!) 97.4 ?F (36.3 ?C) 97.7 ?F (36.5 ?C)  ?TempSrc:  Axillary Axillary Axillary  ?SpO2:      ?  Weight:      ?Height:      ? ? ?Intake/Output Summary (Last 24 hours) at 08/29/2021 1036 ?Last data filed at 08/28/2021 1100 ?Gross per 24 hour  ?Intake 30 ml  ?Output --  ?Net 30 ml  ? ? ?Filed Weights  ? 08/26/21 0533 08/27/21 0437 08/28/21 0534  ?Weight: 72.2 kg 72.4 kg 72.8 kg  ? ? ?Physical Examination: ? ?General:  Average built, not in obvious distress, poor historian, has underlying  dementia, ?HENT:   No scleral pallor or icterus noted. Oral mucosa is moist.  ?Chest:  Clear breath sounds.  Diminished breath sounds bilaterally. No crackles or wheezes.  ?CVS: S1 &S2 heard. No murmur.  Regular rate and rhythm. ?Abdomen: Soft, nontender, nondistended.  Bowel sounds are heard.   ?Extremities: No cyanosis, clubbing or edema.  Peripheral pulses are palpable. ?Psych: Alert, awake and oriented to self, underlying dementia, communicative, confused ?CNS:  No cranial nerve deficits.  Moves all extremities. ?Skin: Warm and dry.  No rashes noted. ? ? ?Data Reviewed:  ? ?CBC: ?Recent Labs  ?Lab 08/24/21 ?1925 08/24/21 ?1942 08/26/21 ?0248 08/27/21 ?5009 08/28/21 ?3818 08/29/21 ?2993  ?WBC 10.5  --  10.8* 8.6 10.1 10.6*  ?NEUTROABS 7.2  --   --   --   --   --   ?HGB 15.5 16.0 14.6 14.7 15.7 16.1  ?HCT 46.1 47.0 41.7 42.7 46.7 47.2  ?MCV 89.2  --  86.0 86.8 87.8 86.6  ?PLT 304  --  285 304 282 369  ? ? ? ?Basic Metabolic Panel: ?Recent Labs  ?Lab 08/25/21 ?7169 08/26/21 ?6789 08/27/21 ?3810 08/28/21 ?1751 08/29/21 ?0258  ?NA 139 136 136 137 137  ?K 3.8 3.1* 3.2* 4.0 3.9  ?CL 108 105 108 106 103  ?CO2 19* 21* '23 22 23  '$ ?GLUCOSE 115* 120* 114* 117* 122*  ?BUN '21 15 18 19 17  '$ ?CREATININE 1.02 0.98 1.02 1.02 1.02  ?CALCIUM 9.2 9.2 9.2 9.4 9.4  ?MG 1.9 1.8 1.8  --  2.0  ? ? ? ?Liver Function Tests: ?Recent Labs  ?Lab 08/24/21 ?1925 08/25/21 ?0410  ?AST 100* 93*  ?ALT 23 24  ?ALKPHOS 58 64  ?BILITOT 0.8 1.2  ?PROT 6.3* 6.5  ?ALBUMIN 3.5 3.4*  ? ? ? ? ?Radiology Studies: ?No results found. ? ? ? LOS: 5 days  ? ? ?Flora Lipps, MD ?[facility] ?08/29/2021, 10:36 AM  ?  ?

## 2021-08-30 ENCOUNTER — Inpatient Hospital Stay (HOSPITAL_COMMUNITY): Payer: Medicare PPO

## 2021-08-30 DIAGNOSIS — I2109 ST elevation (STEMI) myocardial infarction involving other coronary artery of anterior wall: Secondary | ICD-10-CM | POA: Diagnosis not present

## 2021-08-30 DIAGNOSIS — G309 Alzheimer's disease, unspecified: Secondary | ICD-10-CM | POA: Diagnosis not present

## 2021-08-30 DIAGNOSIS — N400 Enlarged prostate without lower urinary tract symptoms: Secondary | ICD-10-CM | POA: Diagnosis not present

## 2021-08-30 DIAGNOSIS — E119 Type 2 diabetes mellitus without complications: Secondary | ICD-10-CM | POA: Diagnosis not present

## 2021-08-30 LAB — CBC
HCT: 46.4 % (ref 39.0–52.0)
Hemoglobin: 16 g/dL (ref 13.0–17.0)
MCH: 29.9 pg (ref 26.0–34.0)
MCHC: 34.5 g/dL (ref 30.0–36.0)
MCV: 86.7 fL (ref 80.0–100.0)
Platelets: 381 10*3/uL (ref 150–400)
RBC: 5.35 MIL/uL (ref 4.22–5.81)
RDW: 12.6 % (ref 11.5–15.5)
WBC: 11.6 10*3/uL — ABNORMAL HIGH (ref 4.0–10.5)
nRBC: 0 % (ref 0.0–0.2)

## 2021-08-30 LAB — GLUCOSE, CAPILLARY
Glucose-Capillary: 122 mg/dL — ABNORMAL HIGH (ref 70–99)
Glucose-Capillary: 205 mg/dL — ABNORMAL HIGH (ref 70–99)
Glucose-Capillary: 144 mg/dL — ABNORMAL HIGH (ref 70–99)
Glucose-Capillary: 147 mg/dL — ABNORMAL HIGH (ref 70–99)

## 2021-08-30 LAB — BASIC METABOLIC PANEL
Anion gap: 11 (ref 5–15)
BUN: 18 mg/dL (ref 8–23)
CO2: 23 mmol/L (ref 22–32)
Calcium: 9.6 mg/dL (ref 8.9–10.3)
Chloride: 102 mmol/L (ref 98–111)
Creatinine, Ser: 1.09 mg/dL (ref 0.61–1.24)
GFR, Estimated: >60 mL/min (ref >60)
Glucose, Bld: 121 mg/dL — ABNORMAL HIGH (ref 70–99)
Potassium: 3.8 mmol/L (ref 3.5–5.1)
Sodium: 136 mmol/L (ref 135–145)

## 2021-08-30 LAB — MAGNESIUM: Magnesium: 2 mg/dL (ref 1.7–2.4)

## 2021-08-30 NOTE — Progress Notes (Signed)
Brief episode of complete heart block noted on telemetry with HR down to 39.  Pt awake, alert, and asymptomatic.  VSS.  Dr. Marcelle Smiling notified via text page.  Will continue to monitor.   ?Of note, patient has had a very poor appetite and poor po intake.  He only ate a few bites of applesauce (with meds) this shift and declined po liquids.  He has also developed a nonproductive, congested cough. ?Jodell Cipro ? ?

## 2021-08-30 NOTE — TOC Progression Note (Addendum)
Transition of Care (TOC) - Progression Note  ? ? ?Patient Details  ?Name: Gregory Lamb ?MRN: [id number] ?Date of Birth: Mar 26, 1934 ? ?Transition of Care (TOC) CM/SW Contact  ?Milas Gain, LCSWA ?Phone Number: ?08/30/2021, 10:04 AM ? ?Clinical Narrative:    ? ?Update- Juliann Pulse with [facility] informed CSW that insurance authorization has been approved. CSW informed MD. ? ? ?Patients insurance authorization currently pending. Patient has SNF bed at [facility].CSW will continue to follow and assist with patients dc planning needs. ? ? ?Expected Discharge Plan: [place] ?Barriers to Discharge: Continued Medical Work up ? ?Expected Discharge Plan and Services ?Expected Discharge Plan: [place] ?In-house Referral: Clinical Social Work ?  ?  ?Living arrangements for the past 2 months: [place] ?                ?  ?  ?  ?  ?  ?  ?  ?  ?  ?  ? ? ?Social Determinants of Health (SDOH) Interventions ?  ? ?Readmission Risk Interventions ?   ? View : No data to display.  ?  ?  ?  ? ? ?

## 2021-08-30 NOTE — Progress Notes (Signed)
?PROGRESS NOTE ? ? ? ?Gregory Lamb  XHB:[id number] DOB: 1933-09-17 DOA: 08/24/2021 ?PCP: Janie Morning, DO  ? ? ?Brief Narrative:  ?Gregory Lamb is a 88 y.o. male with medical history significant for advanced dementia, type 2 diabetes mellitus, benign prostatic hyperplasia, presented to hospital after sustaining a fall unclear etiology.  In the ED, patient had normal blood pressure.  Mildly tachypneic.  There was mild hypokalemia with potassium of 3.1 and creatinine of 1.4.  Troponin I was elevated at 12500 with up to 16 600.  Lactate was elevated at 2.2.  CBC mildly elevated at 10.5 K.  EKG showed ST elevation in leads V3 through V6, all of which appear new relative to most recent prior EKG from July 2013.  Chest x-ray showed bibasilar opacities, concerning for atelectasis versus pneumonia.  Cardiology was consulted from the ED and the case was discussed with patient's daughter.  Plan was to proceed with medical management and patient was started on heparin drip. ?  ?Assessment and Plan: ? ?STEMI (ST elevation myocardial infarction) ([place]) ?Patient presented with a significantly elevated troponin with EKG changes suggestive of ST elevation MI.  Cardiology on board and likely LAD territory infarct.  Received therapeutic Lovenox due to LV thrombus transition to Eliquis at this time.  Continue metoprolol 12.5 twice daily and losartan daily for. No plans for invasive procedure.  Continue Plavix since the patient will be on Eliquis.  2D echocardiogram from 08/25/2021 showed LV ejection fraction of 40 to 45% with regional wall motion abnormality in the mid to apical septal walls with a small adherent LV thrombus.  Patient is unlikely to be good warfarin candidate. lipid panel noted with LDL of 115.  Magnesium 1.8. ? ?Sinus bradycardia with transient third-degree AV block ?-Noted on telemetry ?-Patient likely sleeping, asymptomatic ?-Metoprolol has been discontinued since resting heart rate appears to be in the 40s to  50s during the day ?-This can be readdressed in the outpatient setting ?-Recheck electrolytes ?We will plan to monitor on telemetry for another 24 hours. ?  ?DM2 (diabetes mellitus, type 2) ([place]) ?Continue sliding scale insulin.  Hemoglobin A1c of 6.2.   ?   ?BPH (benign prostatic hyperplasia) ?Continue tamsulosin. ?   ?Lactic acidosis ?Elevated on presentation.  Trended down.  Was on metformin as outpatient but is on hold.  No signs of infection. Procalcitonin 0.10 ? ?Hypokalemia ?Improved after replacement.   ? ?Advanced dementia   Continue memantine. ?  ? DVT prophylaxis:    ?apixaban (ELIQUIS) tablet 5 mg  ? ?Code Status:   ?  Code Status: DNR ? ?Disposition:  ?Physical therapy has recommended skilled nursing facility placement at this time.     ? ?Status is: Inpatient ? ?Remains inpatient appropriate because: ST elevation MI on conservative treatment, LV thrombus on anticoagulation ? ? Family Communication:  ?Patient's daughter updated on the phone  on 08/28/2021 about the clinical condition of the patient. ? ?Consultants:  ?Cardiology ? ?Procedures:  ?None ? ?Antimicrobials:  ?None ? ?Anti-infectives (From admission, onward)  ? ? None  ? ?  ? ?Subjective: ?Patient is confused.  Staff reports that they have noticed that productive cough.  He was also noted to have bradycardia/transient heart block overnight which spontaneously resolved ? ?Objective: ?Vitals:  ? 08/29/21 2123 08/30/21 0324 08/30/21 0404 08/30/21 1124  ?BP: 137/68 (!) 161/74 132/78 106/61  ?Pulse: 62  (!) 56 (!) 50  ?Resp: 16 16 (!) 24 15  ?Temp: 97.8 ?F (36.6 ?C)  98.6 ?  F (37 ?C) 97.9 ?F (36.6 ?C)  ?TempSrc: Oral  Oral Oral  ?SpO2: 93% 95% 95%   ?Weight:   64 kg   ?Height:      ? ? ?Intake/Output Summary (Last 24 hours) at 08/30/2021 1936 ?Last data filed at 08/30/2021 0900 ?Gross per 24 hour  ?Intake 250 ml  ?Output --  ?Net 250 ml  ? ?Filed Weights  ? 08/27/21 0437 08/28/21 0534 08/30/21 0404  ?Weight: 72.4 kg 72.8 kg 64 kg  ? ? ?Physical  Examination: ? ?General exam: Alert, awake, no distress ?Respiratory system: Clear to auscultation. Respiratory effort normal. ?Cardiovascular system:RRR. No murmurs, rubs, gallops. ?Gastrointestinal system: Abdomen is nondistended, soft and nontender. No organomegaly or masses felt. Normal bowel sounds heard. ?Central nervous system:  No focal neurological deficits. ?Extremities: No C/C/E, +pedal pulses ?Skin: No rashes, lesions or ulcers ?Psychiatry: Confused, pleasant ? ? ? ?Data Reviewed:  ? ?CBC: ?Recent Labs  ?Lab 08/24/21 ?1925 08/24/21 ?1942 08/26/21 ?9163 08/27/21 ?8466 08/28/21 ?5993 08/29/21 ?5701 08/30/21 ?7793  ?WBC 10.5  --  10.8* 8.6 10.1 10.6* 11.6*  ?NEUTROABS 7.2  --   --   --   --   --   --   ?HGB 15.5   < > 14.6 14.7 15.7 16.1 16.0  ?HCT 46.1   < > 41.7 42.7 46.7 47.2 46.4  ?MCV 89.2  --  86.0 86.8 87.8 86.6 86.7  ?PLT 304  --  285 304 282 369 381  ? < > = values in this interval not displayed.  ? ? ?Basic Metabolic Panel: ?Recent Labs  ?Lab 08/25/21 ?9030 08/26/21 ?0923 08/27/21 ?3007 08/28/21 ?6226 08/29/21 ?3335 08/30/21 ?4562  ?NA 139 136 136 137 137 136  ?K 3.8 3.1* 3.2* 4.0 3.9 3.8  ?CL 108 105 108 106 103 102  ?CO2 19* 21* '23 22 23 23  '$ ?GLUCOSE 115* 120* 114* 117* 122* 121*  ?BUN '21 15 18 19 17 18  '$ ?CREATININE 1.02 0.98 1.02 1.02 1.02 1.09  ?CALCIUM 9.2 9.2 9.2 9.4 9.4 9.6  ?MG 1.9 1.8 1.8  --  2.0 2.0  ? ? ?Liver Function Tests: ?Recent Labs  ?Lab 08/24/21 ?1925 08/25/21 ?0410  ?AST 100* 93*  ?ALT 23 24  ?ALKPHOS 58 64  ?BILITOT 0.8 1.2  ?PROT 6.3* 6.5  ?ALBUMIN 3.5 3.4*  ? ? ? ?Radiology Studies: ?DG CHEST PORT 1 VIEW ? ?Result Date: 08/30/2021 ?CLINICAL DATA:  Cough. EXAM: PORTABLE CHEST 1 VIEW COMPARISON:  August 24, 2021. FINDINGS: The heart size and mediastinal contours are within normal limits. Both lungs are clear. The visualized skeletal structures are unremarkable. IMPRESSION: No active disease. Aortic Atherosclerosis (ICD10-I70.0). Electronically Signed   By: Marijo Conception M.D.    On: 08/30/2021 09:47   ? ? ? LOS: 6 days  ? ? ?Kathie Dike, MD ?[facility] ?08/30/2021, 7:36 PM  ?  ?

## 2021-08-30 NOTE — Plan of Care (Signed)

## 2021-08-31 DIAGNOSIS — G309 Alzheimer's disease, unspecified: Secondary | ICD-10-CM | POA: Diagnosis not present

## 2021-08-31 DIAGNOSIS — R410 Disorientation, unspecified: Secondary | ICD-10-CM | POA: Diagnosis not present

## 2021-08-31 DIAGNOSIS — Z9181 History of falling: Secondary | ICD-10-CM | POA: Diagnosis not present

## 2021-08-31 DIAGNOSIS — F03C0 Unspecified dementia, severe, without behavioral disturbance, psychotic disturbance, mood disturbance, and anxiety: Secondary | ICD-10-CM | POA: Diagnosis not present

## 2021-08-31 DIAGNOSIS — I2109 ST elevation (STEMI) myocardial infarction involving other coronary artery of anterior wall: Secondary | ICD-10-CM | POA: Diagnosis not present

## 2021-08-31 DIAGNOSIS — E118 Type 2 diabetes mellitus with unspecified complications: Secondary | ICD-10-CM | POA: Diagnosis not present

## 2021-08-31 DIAGNOSIS — Z7401 Bed confinement status: Secondary | ICD-10-CM | POA: Diagnosis not present

## 2021-08-31 DIAGNOSIS — N400 Enlarged prostate without lower urinary tract symptoms: Secondary | ICD-10-CM | POA: Diagnosis not present

## 2021-08-31 DIAGNOSIS — E872 Acidosis, unspecified: Secondary | ICD-10-CM | POA: Diagnosis not present

## 2021-08-31 DIAGNOSIS — E876 Hypokalemia: Secondary | ICD-10-CM | POA: Diagnosis not present

## 2021-08-31 DIAGNOSIS — R5381 Other malaise: Secondary | ICD-10-CM | POA: Diagnosis not present

## 2021-08-31 DIAGNOSIS — R001 Bradycardia, unspecified: Secondary | ICD-10-CM | POA: Diagnosis not present

## 2021-08-31 DIAGNOSIS — R413 Other amnesia: Secondary | ICD-10-CM | POA: Diagnosis not present

## 2021-08-31 DIAGNOSIS — I442 Atrioventricular block, complete: Secondary | ICD-10-CM | POA: Diagnosis not present

## 2021-08-31 DIAGNOSIS — I213 ST elevation (STEMI) myocardial infarction of unspecified site: Secondary | ICD-10-CM | POA: Diagnosis not present

## 2021-08-31 DIAGNOSIS — I255 Ischemic cardiomyopathy: Secondary | ICD-10-CM | POA: Diagnosis not present

## 2021-08-31 DIAGNOSIS — M6281 Muscle weakness (generalized): Secondary | ICD-10-CM | POA: Diagnosis not present

## 2021-08-31 DIAGNOSIS — E119 Type 2 diabetes mellitus without complications: Secondary | ICD-10-CM | POA: Diagnosis not present

## 2021-08-31 DIAGNOSIS — F02C0 Dementia in other diseases classified elsewhere, severe, without behavioral disturbance, psychotic disturbance, mood disturbance, and anxiety: Secondary | ICD-10-CM | POA: Diagnosis not present

## 2021-08-31 DIAGNOSIS — I25119 Atherosclerotic heart disease of native coronary artery with unspecified angina pectoris: Secondary | ICD-10-CM | POA: Diagnosis not present

## 2021-08-31 DIAGNOSIS — I513 Intracardiac thrombosis, not elsewhere classified: Secondary | ICD-10-CM | POA: Diagnosis not present

## 2021-08-31 DIAGNOSIS — I700 Atherosclerosis of aorta: Secondary | ICD-10-CM | POA: Diagnosis not present

## 2021-08-31 DIAGNOSIS — I2510 Atherosclerotic heart disease of native coronary artery without angina pectoris: Secondary | ICD-10-CM | POA: Diagnosis not present

## 2021-08-31 LAB — CBC
HCT: 43.5 % (ref 39.0–52.0)
Hemoglobin: 14.9 g/dL (ref 13.0–17.0)
MCH: 29.8 pg (ref 26.0–34.0)
MCHC: 34.3 g/dL (ref 30.0–36.0)
MCV: 87 fL (ref 80.0–100.0)
Platelets: 406 10*3/uL — ABNORMAL HIGH (ref 150–400)
RBC: 5 MIL/uL (ref 4.22–5.81)
RDW: 12.7 % (ref 11.5–15.5)
WBC: 8.6 10*3/uL (ref 4.0–10.5)
nRBC: 0 % (ref 0.0–0.2)

## 2021-08-31 LAB — GLUCOSE, CAPILLARY
Glucose-Capillary: 129 mg/dL — ABNORMAL HIGH (ref 70–99)
Glucose-Capillary: 241 mg/dL — ABNORMAL HIGH (ref 70–99)

## 2021-08-31 MED ORDER — LOSARTAN POTASSIUM 50 MG PO TABS: 50.0000 mg | ORAL_TABLET | Freq: Every day | ORAL | Status: AC

## 2021-08-31 MED ORDER — APIXABAN 5 MG PO TABS: 5.0000 mg | ORAL_TABLET | Freq: Two times a day (BID) | ORAL | Status: AC

## 2021-08-31 MED ORDER — CLOPIDOGREL BISULFATE 75 MG PO TABS: 75.0000 mg | ORAL_TABLET | Freq: Every day | ORAL | Status: AC

## 2021-08-31 MED ORDER — ROSUVASTATIN CALCIUM 10 MG PO TABS: 10.0000 mg | ORAL_TABLET | Freq: Every day | ORAL | Status: DC

## 2021-08-31 NOTE — Care Management Important Message (Signed)
Important Message ? ?Patient Details  ?Name: Gregory Lamb ?MRN: [id number] ?Date of Birth: September 01, 1933 ? ? ?Medicare Important Message Given:  Yes ? ? ? ? ?Shelda Altes ?08/31/2021, 11:36 AM ?

## 2021-08-31 NOTE — Progress Notes (Signed)
Physical Therapy Treatment ?Patient Details ?Name: Gregory Lamb ?MRN: [id number] ?DOB: 08-04-1933 ?Today's Date: 08/31/2021 ? ? ?History of Present Illness 88 y/o male admitted 3/23 with AMS and fall. Pt found to have STEMI and LV thrombus. PMHx:dementia, DM, BPH, CAD ? ?  ?PT Comments  ? ? Pt pleasant with flat affect and limited verbalization. Pt able to progress to OOB and limited walking in room this session with +2 assist. Pt with lack of awareness of deficits with cognitive deficits significantly impacting function. D/C plan remains appropriate.  ?HR 94  ?  ?Recommendations for follow up therapy are one component of a multi-disciplinary discharge planning process, led by the attending physician.  Recommendations may be updated based on patient status, additional functional criteria and insurance authorization. ? ?Follow Up Recommendations ? Skilled nursing-short term rehab (<3 hours/day) ?  ?  ?Assistance Recommended at Discharge Frequent or constant Supervision/Assistance  ?Patient can return home with the following Two people to help with walking and/or transfers;Two people to help with bathing/dressing/bathroom;Assistance with cooking/housework;Direct supervision/assist for medications management;Direct supervision/assist for financial management;Assist for transportation;Help with stairs or ramp for entrance ?  ?Equipment Recommendations ? Wheelchair (measurements PT);Wheelchair cushion (measurements PT)  ?  ?Recommendations for Other Services   ? ? ?  ?Precautions / Restrictions Precautions ?Precautions: Fall  ?  ? ?Mobility ? Bed Mobility ?Overal bed mobility: Needs Assistance ?Bed Mobility: Rolling, Sidelying to Sit ?Rolling: Mod assist ?Sidelying to sit: Mod assist ?  ?  ?  ?General bed mobility comments: cues for sequence with cues for initiation with pt able to reach for rail and assist with pad to rotate to rolling and physical assist to lift trunk into sitting. Pt with right lean in sitting and  improved balance with LUE on foot board ?  ? ?Transfers ?Overall transfer level: Needs assistance ?  ?Transfers: Sit to/from Stand, Bed to chair/wheelchair/BSC ?Sit to Stand: Mod assist ?Stand pivot transfers: Mod assist ?  ?  ?  ?  ?General transfer comment: mod assist to rise from surface with max cues and assist for balance. Pt able to stand and pivot to right with assist of belt to stabilize. Additional rise from chair with mod assist ?  ? ?Ambulation/Gait ?Ambulation/Gait assistance: Mod assist, +2 safety/equipment ?Gait Distance (Feet): 20 Feet ?Assistive device: Rolling walker (2 wheels) ?Gait Pattern/deviations: Shuffle, Trunk flexed ?  ?Gait velocity interpretation: <1.8 ft/sec, indicate of risk for recurrent falls ?  ?General Gait Details: pt with shuffling gait with maintained left foot forward stance with left rotation of body in RW and flexed trunk. Mod assist for balance and progression of gait with close chair follow ? ? ?Stairs ?  ?  ?  ?  ?  ? ? ?Wheelchair Mobility ?  ? ?Modified Rankin (Stroke Patients Only) ?  ? ? ?  ?Balance Overall balance assessment: Needs assistance ?Sitting-balance support: Single extremity supported ?Sitting balance-Leahy Scale: Poor ?Sitting balance - Comments: min assist for sitting and guarding with LUe support ?  ?Standing balance support: Bilateral upper extremity supported ?Standing balance-Leahy Scale: Poor ?Standing balance comment: RW and physical assist to stand ?  ?  ?  ?  ?  ?  ?  ?  ?  ?  ?  ?  ? ?  ?Cognition Arousal/Alertness: Awake/alert ?Behavior During Therapy: Flat affect ?Overall Cognitive Status: No family/caregiver present to determine baseline cognitive functioning ?Area of Impairment: Orientation, Memory, Following commands, Safety/judgement, Problem solving ?  ?  ?  ?  ?  ?  ?  ?  ?  Orientation Level: Disoriented to, Time, Place, Situation ?  ?Memory: Decreased short-term memory ?Following Commands: Follows one step commands inconsistently, Follows  one step commands with increased time ?Safety/Judgement: Decreased awareness of safety, Decreased awareness of deficits ?  ?Problem Solving: Slow processing ?General Comments: pt initially providing a different name but then corrected it on 2nd try. Pt able to transition to sitting but unaware of balance and sequencing deficits ?  ?  ? ?  ?Exercises General Exercises - Lower Extremity ?Long Arc Quad: AAROM, Both, 10 reps, Seated ? ?  ?General Comments   ?  ?  ? ?Pertinent Vitals/Pain Pain Assessment ?Pain Assessment: No/denies pain  ? ? ?Home Living   ?  ?  ?  ?  ?  ?  ?  ?  ?  ?   ?  ?Prior Function    ?  ?  ?   ? ?PT Goals (current goals can now be found in the care plan section) Progress towards PT goals: Progressing toward goals ? ?  ?Frequency ? ? ? Min 2X/week ? ? ? ?  ?PT Plan Current plan remains appropriate  ? ? ?Co-evaluation   ?  ?  ?  ?  ? ?  ?AM-PAC PT "6 Clicks" Mobility   ?Outcome Measure ? Help needed turning from your back to your side while in a flat bed without using bedrails?: A Lot ?Help needed moving from lying on your back to sitting on the side of a flat bed without using bedrails?: A Lot ?Help needed moving to and from a bed to a chair (including a wheelchair)?: A Lot ?Help needed standing up from a chair using your arms (e.g., wheelchair or bedside chair)?: A Lot ?Help needed to walk in hospital room?: Total ?Help needed climbing 3-5 steps with a railing? : Total ?6 Click Score: 10 ? ?  ?End of Session Equipment Utilized During Treatment: Gait belt ?Activity Tolerance: Patient tolerated treatment well ?Patient left: in chair;with call bell/phone within reach;with chair alarm set;with nursing/sitter in room ?Nurse Communication: Mobility status;Precautions ?PT Visit Diagnosis: Unsteadiness on feet (R26.81);Muscle weakness (generalized) (M62.81);Difficulty in walking, not elsewhere classified (R26.2) ?  ? ? ?Time: [phone] ?PT Time Calculation (min) (ACUTE ONLY): 23 min ? ?Charges:  $Gait  Training: 8-22 mins ?$Therapeutic Activity: 8-22 mins          ?          ? ?Deiondre Harrower P, PT ?Acute Rehabilitation Services ?Pager: [phone] ?Office: [phone] ? ? ? ?Chrissy Ealey B Nelissa Bolduc ?08/31/2021, 10:55 AM ? ?

## 2021-08-31 NOTE — TOC Transition Note (Signed)
Transition of Care (TOC) - CM/SW Discharge Note ? ? ?Patient Details  ?Name: Gregory Lamb ?MRN: [id number] ?Date of Birth: 1934-03-23 ? ?Transition of Care (TOC) CM/SW Contact:  ?Milas Gain, LCSWA ?Phone Number: ?08/31/2021, 1:04 PM ? ? ?Clinical Narrative:    ? ?Patient will DC to: [facility]  ? ?Anticipated DC date: 08/31/2021 ? ?Family notified: [name]  ? ?Transport by: Corey Harold ? ?? ? ?Per MD patient ready for DC to [facility]. RN, patient, patient's family, and facility notified of DC. Discharge Summary sent to facility. RN given number for report tele#[phone] RM#[id number]. DC packet on chart. DNR signed by MD attached to patients DC packet.Ambulance transport requested for patient. ? ?CSW signing off.  ? ?Final next level of care: [place] ?Barriers to Discharge: No Barriers Identified ? ? ?Patient Goals and CMS Choice ?  ?CMS Medicare.gov Compare Post Acute Care list provided to:: Patient Represenative (must comment) (spoke with patients daughter [name]) ?Choice offered to / list presented to : Adult Children (Patients daughter [name]) ? ?Discharge Placement ?  ?           ?Patient chooses bed at: [facility] ?Patient to be transferred to facility by: [facility] ?Name of family member notified: [name] ?Patient and family notified of of transfer: 08/31/21 ? ?Discharge Plan and Services ?In-house Referral: Clinical Social Work ?  ?           ?  ?  ?  ?  ?  ?  ?  ?  ?  ?  ? ?Social Determinants of Health (SDOH) Interventions ?  ? ? ?Readmission Risk Interventions ?   ? View : No data to display.  ?  ?  ?  ? ? ? ? ? ?

## 2021-08-31 NOTE — Progress Notes (Signed)
Called report to [facility] at [phone]. On hold for 5 minutes then was transferred to voicemail. Left a message with RN name and number. ?

## 2021-08-31 NOTE — Discharge Summary (Signed)
Physician Discharge Summary  ?Gregory Lamb IWL:[id number] DOB: [birth year] DOA: 08/24/2021 ? ?PCP: Janie Morning, DO ? ?Admit date: 08/24/2021 ?Discharge date: 08/31/2021 ? ?Admitted From: home ?Disposition:  SNF ? ?Recommendations for Outpatient Follow-up:  ?Follow up with PCP in 1-2 weeks ?Please obtain BMP/CBC in one week ?Follow up with cardiology Dr. Ellyn Hack in 1-2 weeks ?Consider starting on beta blockers as outpatient ?Continue to follow blood sugars at SNF ? ?Discharge Condition: stable ?CODE STATUS: DNR ?Diet recommendation: heart healthy, carb modified ? ?Brief/Interim Summary: ?Gregory Lamb is a 88 y.o. male with medical history significant for advanced dementia, type 2 diabetes mellitus, benign prostatic hyperplasia, presented to hospital after sustaining a fall unclear etiology.  In the ED, patient had normal blood pressure.  Mildly tachypneic.  There was mild hypokalemia with potassium of 3.1 and creatinine of 1.4.  Troponin I was elevated at 12500 with up to 16 600.  Lactate was elevated at 2.2.  CBC mildly elevated at 10.5 K.  EKG showed ST elevation in leads V3 through V6, all of which appear new relative to most recent prior EKG from July 2013.  Chest x-ray showed bibasilar opacities, concerning for atelectasis versus pneumonia.  Cardiology was consulted from the ED and the case was discussed with patient's daughter.  Plan was to proceed with medical management and patient was started on heparin drip. ? ?Discharge Diagnoses:  ?Principal Problem: ?  ST elevation myocardial infarction (STEMI) of anterior wall ([place]) ?Active Problems: ?  Coronary artery disease involving native coronary artery of native heart with angina pectoris ([place]) ?  LV (left ventricular) mural thrombus ?  Memory disorder ?  Hypokalemia ?  Lactic acidosis ?  BPH (benign prostatic hyperplasia) ?  DM2 (diabetes mellitus, type 2) ([place]) ?  Advanced dementia ?  Ischemic cardiomyopathy ? ?STEMI (ST elevation myocardial infarction)  ([place]) ?Patient presented with a significantly elevated troponin with EKG changes suggestive of ST elevation MI.  Cardiology on board and likely LAD territory infarct.  He was treated medically with heparin/lovenox for 72 hours. Received therapeutic Lovenox due to LV thrombus transition to Eliquis at this time.  Continue losartan daily. Cannot use beta blockers due to bradycardia. No plans for invasive procedure.  Continue Plavix since the patient will be on Eliquis.  2D echocardiogram from 08/25/2021 showed LV ejection fraction of 40 to 45% with regional wall motion abnormality in the mid to apical septal walls with a small adherent LV thrombus.  Patient is unlikely to be good warfarin candidate. lipid panel noted with LDL of 115.  Magnesium 1.8. Will need cardiology follow up on discharge ?  ?Sinus bradycardia with transient third-degree AV block ?-Noted on telemetry ?-Patient likely sleeping, asymptomatic ?-Metoprolol was discontinued since resting heart rate appears to be staying in the 40s to 50s during the day ?-since stopping BB, follow up heart rates have been in normal range ?  ?DM2 (diabetes mellitus, type 2) ([place]) ? Hemoglobin A1c of 6.2.  ?Continue to follow blood sugars as outpatient ?Will not restart metformin since he has lactic acidosis on admission  ?   ?BPH (benign prostatic hyperplasia) ?Continue tamsulosin. ?   ?Lactic acidosis ?Elevated on presentation.  Trended down.  Was on metformin as outpatient but is on hold.  No signs of infection. Procalcitonin 0.10 ?  ?Hypokalemia ?Improved after replacement.   ?  ?Advanced dementia   Continue aricept and memantine. ? ?Discharge Instructions ? ?Discharge Instructions   ? ? Diet - low sodium heart healthy  Complete by: As directed ?  ? Increase activity slowly   Complete by: As directed ?  ? ?  ? ?Allergies as of 08/31/2021   ?No Known Allergies ?  ? ?  ?Medication List  ?  ? ?TAKE these medications   ? ?apixaban 5 MG Tabs tablet ?Commonly known as:  ELIQUIS ?Take 1 tablet (5 mg total) by mouth 2 (two) times daily. ?  ?clopidogrel 75 MG tablet ?Commonly known as: PLAVIX ?Take 1 tablet (75 mg total) by mouth daily. ?Start taking on: September 01, 2021 ?  ?donepezil 10 MG tablet ?Commonly known as: ARICEPT ?Take 1 tablet (10 mg total) by mouth daily. ?  ?losartan 50 MG tablet ?Commonly known as: COZAAR ?Take 1 tablet (50 mg total) by mouth daily. Takes 1/2 tablet daily ?What changed:  ?medication strength ?how much to take ?  ?Melatonin 3 MG Caps ?Take 3 mg by mouth as needed. ?  ?memantine 5 MG tablet ?Commonly known as: NAMENDA ?Take 5 mg by mouth 2 (two) times daily. ?  ?rosuvastatin 10 MG tablet ?Commonly known as: CRESTOR ?Take 1 tablet (10 mg total) by mouth daily. ?Start taking on: September 01, 2021 ?  ?tamsulosin 0.4 MG Caps capsule ?Commonly known as: FLOMAX ?Take 1 capsule (0.4 mg total) by mouth daily after supper. ?  ? ?  ? ? ?No Known Allergies ? ?Consultations: ?cardiology ? ? ?Procedures/Studies: ?CT Head Wo Contrast ? ?Result Date: 08/24/2021 ?CLINICAL DATA:  Mental status change, unknown cause EXAM: CT HEAD WITHOUT CONTRAST TECHNIQUE: Contiguous axial images were obtained from the base of the skull through the vertex without intravenous contrast. RADIATION DOSE REDUCTION: This exam was performed according to the departmental dose-optimization program which includes automated exposure control, adjustment of the mA and/or kV according to patient size and/or use of iterative reconstruction technique. COMPARISON:  CT head Oct 15, 2016.  MRI head November 06, 2019. FINDINGS: Brain: No evidence of acute infarction, acute hemorrhage, hydrocephalus, extra-axial collection or mass lesion/mass effect. Cerebral atrophy with mesial temporal predominance. Associated ex vacuo ventricular dilation. Mild patchy white matter hypoattenuation, nonspecific but compatible with chronic microvascular ischemic disease. Vascular: No hyperdense vessel identified. Skull: No acute  orbital findings. Sinuses/Orbits: Small probable retention cyst in the right sphenoid sinus. Otherwise, clear sinuses. No acute orbital findings. Other: No mastoid effusions. IMPRESSION: 1. No evidence of acute intracranial abnormality. 2. Cerebral atrophy with mesial temporal predominance, which is nonspecific but can be seen with Alzheimer's disease. Electronically Signed   By: Margaretha Sheffield M.D.   On: 08/24/2021 20:49  ? ?DG CHEST PORT 1 VIEW ? ?Result Date: 08/30/2021 ?CLINICAL DATA:  Cough. EXAM: PORTABLE CHEST 1 VIEW COMPARISON:  August 24, 2021. FINDINGS: The heart size and mediastinal contours are within normal limits. Both lungs are clear. The visualized skeletal structures are unremarkable. IMPRESSION: No active disease. Aortic Atherosclerosis (ICD10-I70.0). Electronically Signed   By: Marijo Conception M.D.   On: 08/30/2021 09:47  ? ?DG Chest Port 1 View ? ?Result Date: 08/24/2021 ?CLINICAL DATA:  Mental status change EXAM: PORTABLE CHEST 1 VIEW COMPARISON:  Radiograph 12/10/2011 FINDINGS: Unchanged cardiomediastinal silhouette. Left basilar opacities. Streaky right basilar opacities. No large pleural effusion. No visible pneumothorax. No acute osseous abnormality. IMPRESSION: Left basilar opacity and streaky right basilar opacities, which could be atelectasis or pneumonia. Electronically Signed   By: Maurine Simmering M.D.   On: 08/24/2021 20:03  ? ?ECHOCARDIOGRAM COMPLETE ? ?Result Date: 08/25/2021 ?   ECHOCARDIOGRAM REPORT   Patient Name:  Velda Shell Date of Exam: 08/25/2021 Medical Rec #:  [id number]        Height:       68.0 in Accession #:    [id number]       Weight:       160.9 lb Date of Birth:  [birth year]        BSA:          1.863 m? Patient Age:    88 years         BP:           159/82 mmHg Patient Gender: M                HR:           71 bpm. Exam Location:  Inpatient Procedure: 2D Echo and Intracardiac Opacification Agent Indications:    NSTEMI  History:        Patient has no prior history of  Echocardiogram examinations.                 Risk Factors:Diabetes.  Sonographer:    Johny Chess RDCS Referring Phys: [id number] [place]  1. Left ventricular ejection fraction, by estimation,

## 2021-09-01 DIAGNOSIS — E119 Type 2 diabetes mellitus without complications: Secondary | ICD-10-CM | POA: Diagnosis not present

## 2021-09-01 DIAGNOSIS — I2510 Atherosclerotic heart disease of native coronary artery without angina pectoris: Secondary | ICD-10-CM | POA: Diagnosis not present

## 2021-09-01 DIAGNOSIS — E876 Hypokalemia: Secondary | ICD-10-CM | POA: Diagnosis not present

## 2021-09-01 DIAGNOSIS — I513 Intracardiac thrombosis, not elsewhere classified: Secondary | ICD-10-CM | POA: Diagnosis not present

## 2021-09-01 DIAGNOSIS — N400 Enlarged prostate without lower urinary tract symptoms: Secondary | ICD-10-CM | POA: Diagnosis not present

## 2021-09-01 DIAGNOSIS — I255 Ischemic cardiomyopathy: Secondary | ICD-10-CM | POA: Diagnosis not present

## 2021-09-01 DIAGNOSIS — Z9181 History of falling: Secondary | ICD-10-CM | POA: Diagnosis not present

## 2021-09-01 DIAGNOSIS — I213 ST elevation (STEMI) myocardial infarction of unspecified site: Secondary | ICD-10-CM | POA: Diagnosis not present

## 2021-09-01 DIAGNOSIS — F03C0 Unspecified dementia, severe, without behavioral disturbance, psychotic disturbance, mood disturbance, and anxiety: Secondary | ICD-10-CM | POA: Diagnosis not present

## 2021-09-04 DIAGNOSIS — E118 Type 2 diabetes mellitus with unspecified complications: Secondary | ICD-10-CM | POA: Diagnosis not present

## 2021-09-04 DIAGNOSIS — I700 Atherosclerosis of aorta: Secondary | ICD-10-CM | POA: Diagnosis not present

## 2021-09-04 DIAGNOSIS — I255 Ischemic cardiomyopathy: Secondary | ICD-10-CM | POA: Diagnosis not present

## 2021-09-04 DIAGNOSIS — I442 Atrioventricular block, complete: Secondary | ICD-10-CM | POA: Diagnosis not present

## 2021-09-06 DIAGNOSIS — I255 Ischemic cardiomyopathy: Secondary | ICD-10-CM | POA: Diagnosis not present

## 2021-09-06 DIAGNOSIS — R413 Other amnesia: Secondary | ICD-10-CM | POA: Diagnosis not present

## 2021-09-06 DIAGNOSIS — R001 Bradycardia, unspecified: Secondary | ICD-10-CM | POA: Diagnosis not present

## 2021-09-06 DIAGNOSIS — I213 ST elevation (STEMI) myocardial infarction of unspecified site: Secondary | ICD-10-CM | POA: Diagnosis not present

## 2021-09-06 DIAGNOSIS — E119 Type 2 diabetes mellitus without complications: Secondary | ICD-10-CM | POA: Diagnosis not present

## 2021-09-06 DIAGNOSIS — N400 Enlarged prostate without lower urinary tract symptoms: Secondary | ICD-10-CM | POA: Diagnosis not present

## 2021-09-06 DIAGNOSIS — I2510 Atherosclerotic heart disease of native coronary artery without angina pectoris: Secondary | ICD-10-CM | POA: Diagnosis not present

## 2021-09-06 DIAGNOSIS — E876 Hypokalemia: Secondary | ICD-10-CM | POA: Diagnosis not present

## 2021-09-06 DIAGNOSIS — I513 Intracardiac thrombosis, not elsewhere classified: Secondary | ICD-10-CM | POA: Diagnosis not present

## 2021-09-07 DIAGNOSIS — I255 Ischemic cardiomyopathy: Secondary | ICD-10-CM | POA: Diagnosis not present

## 2021-09-07 DIAGNOSIS — N400 Enlarged prostate without lower urinary tract symptoms: Secondary | ICD-10-CM | POA: Diagnosis not present

## 2021-09-07 DIAGNOSIS — E876 Hypokalemia: Secondary | ICD-10-CM | POA: Diagnosis not present

## 2021-09-07 DIAGNOSIS — F03C0 Unspecified dementia, severe, without behavioral disturbance, psychotic disturbance, mood disturbance, and anxiety: Secondary | ICD-10-CM | POA: Diagnosis not present

## 2021-09-07 DIAGNOSIS — Z9181 History of falling: Secondary | ICD-10-CM | POA: Diagnosis not present

## 2021-09-07 DIAGNOSIS — I513 Intracardiac thrombosis, not elsewhere classified: Secondary | ICD-10-CM | POA: Diagnosis not present

## 2021-09-07 DIAGNOSIS — E119 Type 2 diabetes mellitus without complications: Secondary | ICD-10-CM | POA: Diagnosis not present

## 2021-09-07 DIAGNOSIS — I213 ST elevation (STEMI) myocardial infarction of unspecified site: Secondary | ICD-10-CM | POA: Diagnosis not present

## 2021-09-07 DIAGNOSIS — I2510 Atherosclerotic heart disease of native coronary artery without angina pectoris: Secondary | ICD-10-CM | POA: Diagnosis not present

## 2021-09-08 DIAGNOSIS — I2510 Atherosclerotic heart disease of native coronary artery without angina pectoris: Secondary | ICD-10-CM | POA: Diagnosis not present

## 2021-09-08 DIAGNOSIS — E119 Type 2 diabetes mellitus without complications: Secondary | ICD-10-CM | POA: Diagnosis not present

## 2021-09-08 DIAGNOSIS — I255 Ischemic cardiomyopathy: Secondary | ICD-10-CM | POA: Diagnosis not present

## 2021-09-08 DIAGNOSIS — N400 Enlarged prostate without lower urinary tract symptoms: Secondary | ICD-10-CM | POA: Diagnosis not present

## 2021-09-08 DIAGNOSIS — I513 Intracardiac thrombosis, not elsewhere classified: Secondary | ICD-10-CM | POA: Diagnosis not present

## 2021-09-08 DIAGNOSIS — R413 Other amnesia: Secondary | ICD-10-CM | POA: Diagnosis not present

## 2021-09-08 DIAGNOSIS — E876 Hypokalemia: Secondary | ICD-10-CM | POA: Diagnosis not present

## 2021-09-08 DIAGNOSIS — F03C0 Unspecified dementia, severe, without behavioral disturbance, psychotic disturbance, mood disturbance, and anxiety: Secondary | ICD-10-CM | POA: Diagnosis not present

## 2021-09-08 DIAGNOSIS — I213 ST elevation (STEMI) myocardial infarction of unspecified site: Secondary | ICD-10-CM | POA: Diagnosis not present

## 2021-09-12 DIAGNOSIS — I2510 Atherosclerotic heart disease of native coronary artery without angina pectoris: Secondary | ICD-10-CM | POA: Diagnosis not present

## 2021-09-12 DIAGNOSIS — I513 Intracardiac thrombosis, not elsewhere classified: Secondary | ICD-10-CM | POA: Diagnosis not present

## 2021-09-12 DIAGNOSIS — N400 Enlarged prostate without lower urinary tract symptoms: Secondary | ICD-10-CM | POA: Diagnosis not present

## 2021-09-12 DIAGNOSIS — Z9181 History of falling: Secondary | ICD-10-CM | POA: Diagnosis not present

## 2021-09-12 DIAGNOSIS — I255 Ischemic cardiomyopathy: Secondary | ICD-10-CM | POA: Diagnosis not present

## 2021-09-12 DIAGNOSIS — E876 Hypokalemia: Secondary | ICD-10-CM | POA: Diagnosis not present

## 2021-09-12 DIAGNOSIS — F03C0 Unspecified dementia, severe, without behavioral disturbance, psychotic disturbance, mood disturbance, and anxiety: Secondary | ICD-10-CM | POA: Diagnosis not present

## 2021-09-12 DIAGNOSIS — I213 ST elevation (STEMI) myocardial infarction of unspecified site: Secondary | ICD-10-CM | POA: Diagnosis not present

## 2021-09-12 DIAGNOSIS — E119 Type 2 diabetes mellitus without complications: Secondary | ICD-10-CM | POA: Diagnosis not present

## 2021-09-28 DIAGNOSIS — I213 ST elevation (STEMI) myocardial infarction of unspecified site: Secondary | ICD-10-CM | POA: Diagnosis not present

## 2021-09-28 DIAGNOSIS — Z9181 History of falling: Secondary | ICD-10-CM | POA: Diagnosis not present

## 2021-09-28 DIAGNOSIS — E119 Type 2 diabetes mellitus without complications: Secondary | ICD-10-CM | POA: Diagnosis not present

## 2021-09-28 DIAGNOSIS — N400 Enlarged prostate without lower urinary tract symptoms: Secondary | ICD-10-CM | POA: Diagnosis not present

## 2021-09-28 DIAGNOSIS — I2510 Atherosclerotic heart disease of native coronary artery without angina pectoris: Secondary | ICD-10-CM | POA: Diagnosis not present

## 2021-09-28 DIAGNOSIS — F03C0 Unspecified dementia, severe, without behavioral disturbance, psychotic disturbance, mood disturbance, and anxiety: Secondary | ICD-10-CM | POA: Diagnosis not present

## 2021-09-28 DIAGNOSIS — I255 Ischemic cardiomyopathy: Secondary | ICD-10-CM | POA: Diagnosis not present

## 2021-09-28 DIAGNOSIS — I513 Intracardiac thrombosis, not elsewhere classified: Secondary | ICD-10-CM | POA: Diagnosis not present

## 2021-09-28 DIAGNOSIS — E876 Hypokalemia: Secondary | ICD-10-CM | POA: Diagnosis not present

## 2021-10-03 DIAGNOSIS — Z86718 Personal history of other venous thrombosis and embolism: Secondary | ICD-10-CM | POA: Diagnosis not present

## 2021-10-03 DIAGNOSIS — G301 Alzheimer's disease with late onset: Secondary | ICD-10-CM | POA: Diagnosis not present

## 2021-10-03 DIAGNOSIS — I10 Essential (primary) hypertension: Secondary | ICD-10-CM | POA: Diagnosis not present

## 2021-10-03 DIAGNOSIS — N401 Enlarged prostate with lower urinary tract symptoms: Secondary | ICD-10-CM | POA: Diagnosis not present

## 2021-10-03 DIAGNOSIS — F5101 Primary insomnia: Secondary | ICD-10-CM | POA: Diagnosis not present

## 2021-10-03 DIAGNOSIS — E785 Hyperlipidemia, unspecified: Secondary | ICD-10-CM | POA: Diagnosis not present

## 2021-10-03 DIAGNOSIS — F02B3 Dementia in other diseases classified elsewhere, moderate, with mood disturbance: Secondary | ICD-10-CM | POA: Diagnosis not present

## 2021-10-19 DIAGNOSIS — F03918 Unspecified dementia, unspecified severity, with other behavioral disturbance: Secondary | ICD-10-CM | POA: Diagnosis not present

## 2021-10-19 DIAGNOSIS — R451 Restlessness and agitation: Secondary | ICD-10-CM | POA: Diagnosis not present

## 2021-10-23 DIAGNOSIS — Z79899 Other long term (current) drug therapy: Secondary | ICD-10-CM | POA: Diagnosis not present

## 2021-10-31 DIAGNOSIS — F03918 Unspecified dementia, unspecified severity, with other behavioral disturbance: Secondary | ICD-10-CM | POA: Diagnosis not present

## 2021-10-31 DIAGNOSIS — R451 Restlessness and agitation: Secondary | ICD-10-CM | POA: Diagnosis not present

## 2021-11-30 DIAGNOSIS — I110 Hypertensive heart disease with heart failure: Secondary | ICD-10-CM | POA: Diagnosis not present

## 2021-11-30 DIAGNOSIS — E782 Mixed hyperlipidemia: Secondary | ICD-10-CM | POA: Diagnosis not present

## 2021-12-28 DIAGNOSIS — I110 Hypertensive heart disease with heart failure: Secondary | ICD-10-CM | POA: Diagnosis not present

## 2021-12-28 DIAGNOSIS — I2109 ST elevation (STEMI) myocardial infarction involving other coronary artery of anterior wall: Secondary | ICD-10-CM | POA: Diagnosis not present

## 2022-01-02 DIAGNOSIS — M6281 Muscle weakness (generalized): Secondary | ICD-10-CM | POA: Diagnosis not present

## 2022-01-02 DIAGNOSIS — R278 Other lack of coordination: Secondary | ICD-10-CM | POA: Diagnosis not present

## 2022-01-02 DIAGNOSIS — F02818 Dementia in other diseases classified elsewhere, unspecified severity, with other behavioral disturbance: Secondary | ICD-10-CM | POA: Diagnosis not present

## 2022-01-02 DIAGNOSIS — R293 Abnormal posture: Secondary | ICD-10-CM | POA: Diagnosis not present

## 2022-01-02 DIAGNOSIS — R262 Difficulty in walking, not elsewhere classified: Secondary | ICD-10-CM | POA: Diagnosis not present

## 2022-01-04 DIAGNOSIS — R262 Difficulty in walking, not elsewhere classified: Secondary | ICD-10-CM | POA: Diagnosis not present

## 2022-01-04 DIAGNOSIS — M6281 Muscle weakness (generalized): Secondary | ICD-10-CM | POA: Diagnosis not present

## 2022-01-04 DIAGNOSIS — F02818 Dementia in other diseases classified elsewhere, unspecified severity, with other behavioral disturbance: Secondary | ICD-10-CM | POA: Diagnosis not present

## 2022-01-04 DIAGNOSIS — R293 Abnormal posture: Secondary | ICD-10-CM | POA: Diagnosis not present

## 2022-01-04 DIAGNOSIS — R278 Other lack of coordination: Secondary | ICD-10-CM | POA: Diagnosis not present

## 2022-01-05 DIAGNOSIS — F0390 Unspecified dementia without behavioral disturbance: Secondary | ICD-10-CM | POA: Diagnosis not present

## 2022-01-05 DIAGNOSIS — I2510 Atherosclerotic heart disease of native coronary artery without angina pectoris: Secondary | ICD-10-CM | POA: Diagnosis not present

## 2022-01-05 DIAGNOSIS — E119 Type 2 diabetes mellitus without complications: Secondary | ICD-10-CM | POA: Diagnosis not present

## 2022-01-09 DIAGNOSIS — R278 Other lack of coordination: Secondary | ICD-10-CM | POA: Diagnosis not present

## 2022-01-09 DIAGNOSIS — R293 Abnormal posture: Secondary | ICD-10-CM | POA: Diagnosis not present

## 2022-01-09 DIAGNOSIS — M6281 Muscle weakness (generalized): Secondary | ICD-10-CM | POA: Diagnosis not present

## 2022-01-09 DIAGNOSIS — R262 Difficulty in walking, not elsewhere classified: Secondary | ICD-10-CM | POA: Diagnosis not present

## 2022-01-09 DIAGNOSIS — F02818 Dementia in other diseases classified elsewhere, unspecified severity, with other behavioral disturbance: Secondary | ICD-10-CM | POA: Diagnosis not present

## 2022-01-10 DIAGNOSIS — R293 Abnormal posture: Secondary | ICD-10-CM | POA: Diagnosis not present

## 2022-01-10 DIAGNOSIS — R262 Difficulty in walking, not elsewhere classified: Secondary | ICD-10-CM | POA: Diagnosis not present

## 2022-01-10 DIAGNOSIS — R278 Other lack of coordination: Secondary | ICD-10-CM | POA: Diagnosis not present

## 2022-01-10 DIAGNOSIS — M6281 Muscle weakness (generalized): Secondary | ICD-10-CM | POA: Diagnosis not present

## 2022-01-10 DIAGNOSIS — F02818 Dementia in other diseases classified elsewhere, unspecified severity, with other behavioral disturbance: Secondary | ICD-10-CM | POA: Diagnosis not present

## 2022-01-11 DIAGNOSIS — R2689 Other abnormalities of gait and mobility: Secondary | ICD-10-CM | POA: Diagnosis not present

## 2022-01-12 DIAGNOSIS — F02818 Dementia in other diseases classified elsewhere, unspecified severity, with other behavioral disturbance: Secondary | ICD-10-CM | POA: Diagnosis not present

## 2022-01-12 DIAGNOSIS — R293 Abnormal posture: Secondary | ICD-10-CM | POA: Diagnosis not present

## 2022-01-12 DIAGNOSIS — M6281 Muscle weakness (generalized): Secondary | ICD-10-CM | POA: Diagnosis not present

## 2022-01-12 DIAGNOSIS — R278 Other lack of coordination: Secondary | ICD-10-CM | POA: Diagnosis not present

## 2022-01-12 DIAGNOSIS — R262 Difficulty in walking, not elsewhere classified: Secondary | ICD-10-CM | POA: Diagnosis not present

## 2022-01-16 DIAGNOSIS — R278 Other lack of coordination: Secondary | ICD-10-CM | POA: Diagnosis not present

## 2022-01-16 DIAGNOSIS — R262 Difficulty in walking, not elsewhere classified: Secondary | ICD-10-CM | POA: Diagnosis not present

## 2022-01-16 DIAGNOSIS — F02818 Dementia in other diseases classified elsewhere, unspecified severity, with other behavioral disturbance: Secondary | ICD-10-CM | POA: Diagnosis not present

## 2022-01-16 DIAGNOSIS — M6281 Muscle weakness (generalized): Secondary | ICD-10-CM | POA: Diagnosis not present

## 2022-01-16 DIAGNOSIS — R293 Abnormal posture: Secondary | ICD-10-CM | POA: Diagnosis not present

## 2022-01-17 DIAGNOSIS — R293 Abnormal posture: Secondary | ICD-10-CM | POA: Diagnosis not present

## 2022-01-17 DIAGNOSIS — R278 Other lack of coordination: Secondary | ICD-10-CM | POA: Diagnosis not present

## 2022-01-17 DIAGNOSIS — R262 Difficulty in walking, not elsewhere classified: Secondary | ICD-10-CM | POA: Diagnosis not present

## 2022-01-17 DIAGNOSIS — F02818 Dementia in other diseases classified elsewhere, unspecified severity, with other behavioral disturbance: Secondary | ICD-10-CM | POA: Diagnosis not present

## 2022-01-17 DIAGNOSIS — R2689 Other abnormalities of gait and mobility: Secondary | ICD-10-CM | POA: Diagnosis not present

## 2022-01-17 DIAGNOSIS — M6281 Muscle weakness (generalized): Secondary | ICD-10-CM | POA: Diagnosis not present

## 2022-01-19 DIAGNOSIS — R262 Difficulty in walking, not elsewhere classified: Secondary | ICD-10-CM | POA: Diagnosis not present

## 2022-01-19 DIAGNOSIS — R2689 Other abnormalities of gait and mobility: Secondary | ICD-10-CM | POA: Diagnosis not present

## 2022-01-19 DIAGNOSIS — R293 Abnormal posture: Secondary | ICD-10-CM | POA: Diagnosis not present

## 2022-01-19 DIAGNOSIS — M6281 Muscle weakness (generalized): Secondary | ICD-10-CM | POA: Diagnosis not present

## 2022-01-19 DIAGNOSIS — F02818 Dementia in other diseases classified elsewhere, unspecified severity, with other behavioral disturbance: Secondary | ICD-10-CM | POA: Diagnosis not present

## 2022-01-19 DIAGNOSIS — R278 Other lack of coordination: Secondary | ICD-10-CM | POA: Diagnosis not present

## 2022-01-23 DIAGNOSIS — R262 Difficulty in walking, not elsewhere classified: Secondary | ICD-10-CM | POA: Diagnosis not present

## 2022-01-23 DIAGNOSIS — R293 Abnormal posture: Secondary | ICD-10-CM | POA: Diagnosis not present

## 2022-01-23 DIAGNOSIS — M6281 Muscle weakness (generalized): Secondary | ICD-10-CM | POA: Diagnosis not present

## 2022-01-23 DIAGNOSIS — F02818 Dementia in other diseases classified elsewhere, unspecified severity, with other behavioral disturbance: Secondary | ICD-10-CM | POA: Diagnosis not present

## 2022-01-23 DIAGNOSIS — R278 Other lack of coordination: Secondary | ICD-10-CM | POA: Diagnosis not present

## 2022-01-24 DIAGNOSIS — R2689 Other abnormalities of gait and mobility: Secondary | ICD-10-CM | POA: Diagnosis not present

## 2022-01-24 DIAGNOSIS — M6281 Muscle weakness (generalized): Secondary | ICD-10-CM | POA: Diagnosis not present

## 2022-01-24 DIAGNOSIS — R279 Unspecified lack of coordination: Secondary | ICD-10-CM | POA: Diagnosis not present

## 2022-01-25 DIAGNOSIS — M6281 Muscle weakness (generalized): Secondary | ICD-10-CM | POA: Diagnosis not present

## 2022-01-25 DIAGNOSIS — R278 Other lack of coordination: Secondary | ICD-10-CM | POA: Diagnosis not present

## 2022-01-25 DIAGNOSIS — R262 Difficulty in walking, not elsewhere classified: Secondary | ICD-10-CM | POA: Diagnosis not present

## 2022-01-25 DIAGNOSIS — F02818 Dementia in other diseases classified elsewhere, unspecified severity, with other behavioral disturbance: Secondary | ICD-10-CM | POA: Diagnosis not present

## 2022-01-25 DIAGNOSIS — R293 Abnormal posture: Secondary | ICD-10-CM | POA: Diagnosis not present

## 2022-01-26 DIAGNOSIS — M6281 Muscle weakness (generalized): Secondary | ICD-10-CM | POA: Diagnosis not present

## 2022-01-26 DIAGNOSIS — R293 Abnormal posture: Secondary | ICD-10-CM | POA: Diagnosis not present

## 2022-01-26 DIAGNOSIS — R279 Unspecified lack of coordination: Secondary | ICD-10-CM | POA: Diagnosis not present

## 2022-01-26 DIAGNOSIS — F02818 Dementia in other diseases classified elsewhere, unspecified severity, with other behavioral disturbance: Secondary | ICD-10-CM | POA: Diagnosis not present

## 2022-01-26 DIAGNOSIS — R278 Other lack of coordination: Secondary | ICD-10-CM | POA: Diagnosis not present

## 2022-01-26 DIAGNOSIS — R262 Difficulty in walking, not elsewhere classified: Secondary | ICD-10-CM | POA: Diagnosis not present

## 2022-01-26 DIAGNOSIS — R2689 Other abnormalities of gait and mobility: Secondary | ICD-10-CM | POA: Diagnosis not present

## 2022-01-30 DIAGNOSIS — R2689 Other abnormalities of gait and mobility: Secondary | ICD-10-CM | POA: Diagnosis not present

## 2022-01-30 DIAGNOSIS — F02818 Dementia in other diseases classified elsewhere, unspecified severity, with other behavioral disturbance: Secondary | ICD-10-CM | POA: Diagnosis not present

## 2022-01-30 DIAGNOSIS — R279 Unspecified lack of coordination: Secondary | ICD-10-CM | POA: Diagnosis not present

## 2022-01-30 DIAGNOSIS — M6281 Muscle weakness (generalized): Secondary | ICD-10-CM | POA: Diagnosis not present

## 2022-01-30 DIAGNOSIS — R293 Abnormal posture: Secondary | ICD-10-CM | POA: Diagnosis not present

## 2022-01-30 DIAGNOSIS — R278 Other lack of coordination: Secondary | ICD-10-CM | POA: Diagnosis not present

## 2022-01-30 DIAGNOSIS — R262 Difficulty in walking, not elsewhere classified: Secondary | ICD-10-CM | POA: Diagnosis not present

## 2022-01-31 DIAGNOSIS — R262 Difficulty in walking, not elsewhere classified: Secondary | ICD-10-CM | POA: Diagnosis not present

## 2022-01-31 DIAGNOSIS — R293 Abnormal posture: Secondary | ICD-10-CM | POA: Diagnosis not present

## 2022-01-31 DIAGNOSIS — R2689 Other abnormalities of gait and mobility: Secondary | ICD-10-CM | POA: Diagnosis not present

## 2022-01-31 DIAGNOSIS — M6281 Muscle weakness (generalized): Secondary | ICD-10-CM | POA: Diagnosis not present

## 2022-01-31 DIAGNOSIS — R278 Other lack of coordination: Secondary | ICD-10-CM | POA: Diagnosis not present

## 2022-01-31 DIAGNOSIS — R279 Unspecified lack of coordination: Secondary | ICD-10-CM | POA: Diagnosis not present

## 2022-01-31 DIAGNOSIS — F02818 Dementia in other diseases classified elsewhere, unspecified severity, with other behavioral disturbance: Secondary | ICD-10-CM | POA: Diagnosis not present

## 2022-02-01 DIAGNOSIS — R293 Abnormal posture: Secondary | ICD-10-CM | POA: Diagnosis not present

## 2022-02-01 DIAGNOSIS — M6281 Muscle weakness (generalized): Secondary | ICD-10-CM | POA: Diagnosis not present

## 2022-02-01 DIAGNOSIS — F02818 Dementia in other diseases classified elsewhere, unspecified severity, with other behavioral disturbance: Secondary | ICD-10-CM | POA: Diagnosis not present

## 2022-02-01 DIAGNOSIS — R262 Difficulty in walking, not elsewhere classified: Secondary | ICD-10-CM | POA: Diagnosis not present

## 2022-02-01 DIAGNOSIS — R278 Other lack of coordination: Secondary | ICD-10-CM | POA: Diagnosis not present

## 2022-05-25 ENCOUNTER — Encounter (HOSPITAL_COMMUNITY): Payer: Self-pay | Admitting: Emergency Medicine

## 2022-05-25 ENCOUNTER — Other Ambulatory Visit (HOSPITAL_COMMUNITY): Payer: Medicare PPO

## 2022-05-25 ENCOUNTER — Inpatient Hospital Stay (HOSPITAL_COMMUNITY)
Admission: EM | Admit: 2022-05-25 | Discharge: 2022-05-27 | DRG: 280 | Disposition: A | Discharge status: Skilled Nursing Facility | Payer: Medicare PPO | Source: Skilled Nursing Facility | Attending: Internal Medicine | Admitting: Internal Medicine

## 2022-05-25 ENCOUNTER — Emergency Department (HOSPITAL_COMMUNITY): Payer: Medicare PPO

## 2022-05-25 ENCOUNTER — Other Ambulatory Visit: Payer: Self-pay

## 2022-05-25 DIAGNOSIS — F05 Delirium due to known physiological condition: Secondary | ICD-10-CM | POA: Diagnosis not present

## 2022-05-25 DIAGNOSIS — I513 Intracardiac thrombosis, not elsewhere classified: Secondary | ICD-10-CM | POA: Diagnosis present

## 2022-05-25 DIAGNOSIS — I25119 Atherosclerotic heart disease of native coronary artery with unspecified angina pectoris: Secondary | ICD-10-CM

## 2022-05-25 DIAGNOSIS — E785 Hyperlipidemia, unspecified: Secondary | ICD-10-CM | POA: Diagnosis present

## 2022-05-25 DIAGNOSIS — I252 Old myocardial infarction: Secondary | ICD-10-CM

## 2022-05-25 DIAGNOSIS — I5020 Unspecified systolic (congestive) heart failure: Secondary | ICD-10-CM | POA: Diagnosis present

## 2022-05-25 DIAGNOSIS — I10 Essential (primary) hypertension: Secondary | ICD-10-CM

## 2022-05-25 DIAGNOSIS — I110 Hypertensive heart disease with heart failure: Secondary | ICD-10-CM | POA: Diagnosis present

## 2022-05-25 DIAGNOSIS — R269 Unspecified abnormalities of gait and mobility: Secondary | ICD-10-CM | POA: Diagnosis present

## 2022-05-25 DIAGNOSIS — G309 Alzheimer's disease, unspecified: Secondary | ICD-10-CM

## 2022-05-25 DIAGNOSIS — N400 Enlarged prostate without lower urinary tract symptoms: Secondary | ICD-10-CM | POA: Diagnosis present

## 2022-05-25 DIAGNOSIS — N179 Acute kidney failure, unspecified: Secondary | ICD-10-CM | POA: Diagnosis present

## 2022-05-25 DIAGNOSIS — I214 Non-ST elevation (NSTEMI) myocardial infarction: Secondary | ICD-10-CM | POA: Diagnosis not present

## 2022-05-25 DIAGNOSIS — Z7901 Long term (current) use of anticoagulants: Secondary | ICD-10-CM

## 2022-05-25 DIAGNOSIS — I5022 Chronic systolic (congestive) heart failure: Secondary | ICD-10-CM | POA: Diagnosis present

## 2022-05-25 DIAGNOSIS — U071 COVID-19: Secondary | ICD-10-CM | POA: Diagnosis present

## 2022-05-25 DIAGNOSIS — E119 Type 2 diabetes mellitus without complications: Secondary | ICD-10-CM | POA: Diagnosis present

## 2022-05-25 DIAGNOSIS — I25110 Atherosclerotic heart disease of native coronary artery with unstable angina pectoris: Secondary | ICD-10-CM | POA: Diagnosis present

## 2022-05-25 DIAGNOSIS — E872 Acidosis, unspecified: Secondary | ICD-10-CM | POA: Diagnosis present

## 2022-05-25 DIAGNOSIS — I440 Atrioventricular block, first degree: Secondary | ICD-10-CM | POA: Diagnosis present

## 2022-05-25 DIAGNOSIS — Z9079 Acquired absence of other genital organ(s): Secondary | ICD-10-CM

## 2022-05-25 DIAGNOSIS — Z66 Do not resuscitate: Secondary | ICD-10-CM | POA: Diagnosis present

## 2022-05-25 DIAGNOSIS — I358 Other nonrheumatic aortic valve disorders: Secondary | ICD-10-CM | POA: Diagnosis present

## 2022-05-25 DIAGNOSIS — Z961 Presence of intraocular lens: Secondary | ICD-10-CM | POA: Diagnosis present

## 2022-05-25 DIAGNOSIS — Z974 Presence of external hearing-aid: Secondary | ICD-10-CM

## 2022-05-25 DIAGNOSIS — I255 Ischemic cardiomyopathy: Secondary | ICD-10-CM | POA: Diagnosis not present

## 2022-05-25 DIAGNOSIS — G4730 Sleep apnea, unspecified: Secondary | ICD-10-CM | POA: Diagnosis present

## 2022-05-25 DIAGNOSIS — F03C0 Unspecified dementia, severe, without behavioral disturbance, psychotic disturbance, mood disturbance, and anxiety: Secondary | ICD-10-CM | POA: Diagnosis present

## 2022-05-25 DIAGNOSIS — R7989 Other specified abnormal findings of blood chemistry: Secondary | ICD-10-CM

## 2022-05-25 DIAGNOSIS — I21A1 Myocardial infarction type 2: Secondary | ICD-10-CM | POA: Diagnosis present

## 2022-05-25 DIAGNOSIS — R001 Bradycardia, unspecified: Secondary | ICD-10-CM | POA: Diagnosis present

## 2022-05-25 DIAGNOSIS — Z79899 Other long term (current) drug therapy: Secondary | ICD-10-CM

## 2022-05-25 DIAGNOSIS — F02C0 Dementia in other diseases classified elsewhere, severe, without behavioral disturbance, psychotic disturbance, mood disturbance, and anxiety: Secondary | ICD-10-CM

## 2022-05-25 DIAGNOSIS — Z87891 Personal history of nicotine dependence: Secondary | ICD-10-CM | POA: Diagnosis not present

## 2022-05-25 DIAGNOSIS — I2510 Atherosclerotic heart disease of native coronary artery without angina pectoris: Secondary | ICD-10-CM | POA: Insufficient documentation

## 2022-05-25 DIAGNOSIS — H918X3 Other specified hearing loss, bilateral: Secondary | ICD-10-CM | POA: Diagnosis present

## 2022-05-25 DIAGNOSIS — Z7902 Long term (current) use of antithrombotics/antiplatelets: Secondary | ICD-10-CM

## 2022-05-25 LAB — URINALYSIS, ROUTINE W REFLEX MICROSCOPIC
Bilirubin Urine: NEGATIVE
Glucose, UA: NEGATIVE mg/dL
Ketones, ur: NEGATIVE mg/dL
Leukocytes,Ua: NEGATIVE
Nitrite: NEGATIVE
Protein, ur: 100 mg/dL — AB
Specific Gravity, Urine: 1.026 (ref 1.005–1.030)
pH: 5 (ref 5.0–8.0)

## 2022-05-25 LAB — GLUCOSE, CAPILLARY
Glucose-Capillary: 81 mg/dL (ref 70–99)
Glucose-Capillary: 73 mg/dL (ref 70–99)

## 2022-05-25 LAB — LACTATE DEHYDROGENASE: LDH: 245 U/L — ABNORMAL HIGH (ref 98–192)

## 2022-05-25 LAB — RESP PANEL BY RT-PCR (RSV, FLU A&B, COVID)  RVPGX2
Influenza A by PCR: NEGATIVE
Influenza B by PCR: NEGATIVE
Resp Syncytial Virus by PCR: NEGATIVE
SARS Coronavirus 2 by RT PCR: POSITIVE — AB

## 2022-05-25 LAB — BASIC METABOLIC PANEL
Anion gap: 10 (ref 5–15)
BUN: 29 mg/dL — ABNORMAL HIGH (ref 8–23)
CO2: 25 mmol/L (ref 22–32)
Calcium: 9.9 mg/dL (ref 8.9–10.3)
Chloride: 109 mmol/L (ref 98–111)
Creatinine, Ser: 1.42 mg/dL — ABNORMAL HIGH (ref 0.61–1.24)
GFR, Estimated: 48 mL/min — ABNORMAL LOW (ref >60)
Glucose, Bld: 165 mg/dL — ABNORMAL HIGH (ref 70–99)
Potassium: 4.1 mmol/L (ref 3.5–5.1)
Sodium: 144 mmol/L (ref 135–145)

## 2022-05-25 LAB — CBC
HCT: 38.1 % — ABNORMAL LOW (ref 39.0–52.0)
Hemoglobin: 12.9 g/dL — ABNORMAL LOW (ref 13.0–17.0)
MCH: 31.1 pg (ref 26.0–34.0)
MCHC: 33.9 g/dL (ref 30.0–36.0)
MCV: 91.8 fL (ref 80.0–100.0)
Platelets: 301 10*3/uL (ref 150–400)
RBC: 4.15 MIL/uL — ABNORMAL LOW (ref 4.22–5.81)
RDW: 13 % (ref 11.5–15.5)
WBC: 11.3 10*3/uL — ABNORMAL HIGH (ref 4.0–10.5)
nRBC: 0 % (ref 0.0–0.2)

## 2022-05-25 LAB — TROPONIN I (HIGH SENSITIVITY)
Troponin I (High Sensitivity): 2560 ng/L (ref <18)
Troponin I (High Sensitivity): 2580 ng/L (ref <18)

## 2022-05-25 LAB — PROCALCITONIN: Procalcitonin: <0.1 ng/mL

## 2022-05-25 LAB — C-REACTIVE PROTEIN: CRP: 1.1 mg/dL — ABNORMAL HIGH (ref <1.0)

## 2022-05-25 LAB — D-DIMER, QUANTITATIVE: D-Dimer, Quant: <0.27 ug/mL-FEU (ref 0.00–0.50)

## 2022-05-25 LAB — BRAIN NATRIURETIC PEPTIDE: B Natriuretic Peptide: 271.2 pg/mL — ABNORMAL HIGH (ref 0.0–100.0)

## 2022-05-25 MED ORDER — MEMANTINE HCL 10 MG PO TABS
5.0000 mg | ORAL_TABLET | Freq: Two times a day (BID) | ORAL | Status: DC
  Administered 2022-05-25: 5 mg via ORAL
  Filled 2022-05-25: qty 1

## 2022-05-25 MED ORDER — SODIUM CHLORIDE 0.9% FLUSH
3.0000 mL | Freq: Two times a day (BID) | INTRAVENOUS | Status: DC
  Administered 2022-05-25 – 2022-05-27 (×5): 3 mL via INTRAVENOUS

## 2022-05-25 MED ORDER — ROSUVASTATIN CALCIUM 5 MG PO TABS
10.0000 mg | ORAL_TABLET | Freq: Every day | ORAL | Status: DC
  Administered 2022-05-25 – 2022-05-26 (×2): 10 mg via ORAL
  Filled 2022-05-25 (×2): qty 2

## 2022-05-25 MED ORDER — DONEPEZIL HCL 10 MG PO TABS
10.0000 mg | ORAL_TABLET | Freq: Every day | ORAL | Status: DC
  Administered 2022-05-25 – 2022-05-27 (×3): 10 mg via ORAL
  Filled 2022-05-25 (×3): qty 1

## 2022-05-25 MED ORDER — GUAIFENESIN-DM 100-10 MG/5ML PO SYRP: 10.0000 mL | ORAL_SOLUTION | ORAL | Status: DC | PRN

## 2022-05-25 MED ORDER — ASPIRIN 81 MG PO CHEW
324.0000 mg | CHEWABLE_TABLET | Freq: Once | ORAL | Status: AC
Start: 2022-05-25 — End: 2022-05-25
  Administered 2022-05-25: 324 mg via ORAL
  Filled 2022-05-25: qty 4

## 2022-05-25 MED ORDER — TAMSULOSIN HCL 0.4 MG PO CAPS
0.4000 mg | ORAL_CAPSULE | Freq: Every day | ORAL | Status: DC
  Administered 2022-05-26 – 2022-05-27 (×3): 0.4 mg via ORAL
  Filled 2022-05-25 (×3): qty 1

## 2022-05-25 MED ORDER — ZINC SULFATE 220 (50 ZN) MG PO CAPS
220.0000 mg | ORAL_CAPSULE | Freq: Every day | ORAL | Status: DC
  Administered 2022-05-25 – 2022-05-27 (×3): 220 mg via ORAL
  Filled 2022-05-25 (×3): qty 1

## 2022-05-25 MED ORDER — PANTOPRAZOLE SODIUM 40 MG IV SOLR: 40.0000 mg | INTRAVENOUS | Status: DC

## 2022-05-25 MED ORDER — MEMANTINE HCL 10 MG PO TABS
10.0000 mg | ORAL_TABLET | Freq: Two times a day (BID) | ORAL | Status: DC
  Administered 2022-05-25 – 2022-05-27 (×6): 10 mg via ORAL
  Filled 2022-05-25 (×6): qty 1

## 2022-05-25 MED ORDER — APIXABAN 5 MG PO TABS
5.0000 mg | ORAL_TABLET | Freq: Two times a day (BID) | ORAL | Status: DC
  Administered 2022-05-25 – 2022-05-27 (×6): 5 mg via ORAL
  Filled 2022-05-25 (×6): qty 1

## 2022-05-25 MED ORDER — ALBUTEROL SULFATE (2.5 MG/3ML) 0.083% IN NEBU: 2.5000 mg | INHALATION_SOLUTION | Freq: Four times a day (QID) | RESPIRATORY_TRACT | Status: DC | PRN

## 2022-05-25 MED ORDER — VITAMIN C 500 MG PO TABS
500.0000 mg | ORAL_TABLET | Freq: Every day | ORAL | Status: DC
  Administered 2022-05-25 – 2022-05-27 (×3): 500 mg via ORAL
  Filled 2022-05-25 (×3): qty 1

## 2022-05-25 MED ORDER — ACETAMINOPHEN 650 MG RE SUPP: 650.0000 mg | Freq: Four times a day (QID) | RECTAL | Status: DC | PRN

## 2022-05-25 MED ORDER — LOXAPINE SUCCINATE 5 MG PO CAPS
5.0000 mg | ORAL_CAPSULE | Freq: Every morning | ORAL | Status: DC
  Administered 2022-05-25 – 2022-05-27 (×3): 5 mg via ORAL
  Filled 2022-05-25 (×4): qty 1

## 2022-05-25 MED ORDER — ALBUTEROL SULFATE HFA 108 (90 BASE) MCG/ACT IN AERS
2.0000 | INHALATION_SPRAY | Freq: Four times a day (QID) | RESPIRATORY_TRACT | Status: DC
  Administered 2022-05-25 – 2022-05-27 (×11): 2 via RESPIRATORY_TRACT
  Filled 2022-05-25 (×3): qty 6.7

## 2022-05-25 MED ORDER — MELATONIN 3 MG PO TABS
3.0000 mg | ORAL_TABLET | Freq: Every day | ORAL | Status: DC
  Administered 2022-05-25 – 2022-05-27 (×3): 3 mg via ORAL
  Filled 2022-05-25 (×3): qty 1

## 2022-05-25 MED ORDER — HEPARIN (PORCINE) 25000 UT/250ML-% IV SOLN
800.0000 [IU]/h | INTRAVENOUS | Status: DC
  Administered 2022-05-25: 800 [IU]/h via INTRAVENOUS
  Filled 2022-05-25: qty 250

## 2022-05-25 MED ORDER — PANTOPRAZOLE SODIUM 40 MG PO TBEC
40.0000 mg | DELAYED_RELEASE_TABLET | Freq: Every day | ORAL | Status: DC
  Administered 2022-05-25 – 2022-05-27 (×3): 40 mg via ORAL
  Filled 2022-05-25 (×3): qty 1

## 2022-05-25 MED ORDER — HYDROCOD POLI-CHLORPHE POLI ER 10-8 MG/5ML PO SUER: 5.0000 mL | Freq: Two times a day (BID) | ORAL | Status: DC | PRN

## 2022-05-25 MED ORDER — SODIUM CHLORIDE 0.9 % IV SOLN: INTRAVENOUS | Status: AC

## 2022-05-25 MED ORDER — CLOPIDOGREL BISULFATE 75 MG PO TABS
75.0000 mg | ORAL_TABLET | Freq: Every day | ORAL | Status: DC
  Administered 2022-05-25 – 2022-05-27 (×3): 75 mg via ORAL
  Filled 2022-05-25 (×3): qty 1

## 2022-05-25 MED ORDER — ACETAMINOPHEN 325 MG PO TABS
650.0000 mg | ORAL_TABLET | Freq: Four times a day (QID) | ORAL | Status: DC | PRN
  Administered 2022-05-26: 650 mg via ORAL
  Filled 2022-05-25: qty 2

## 2022-05-25 NOTE — ED Notes (Signed)
Pt in triage

## 2022-05-25 NOTE — Progress Notes (Signed)
ANTICOAGULATION CONSULT NOTE - Initial Consult  Pharmacy Consult for Heparin infusion Indication: chest pain/ACS and history of LV thrombus (08/25/21)  No Known Allergies  Patient Measurements: Height: '5\' 8"'$  (172.7 cm) Weight: 64 kg (141 lb) IBW/kg (Calculated) : 68.4 Heparin Dosing Weight: 64 kg  Vital Signs: Temp: 98.4 F (36.9 C) (12/22 0723) Temp Source: Oral (12/22 0723) BP: 109/53 (12/22 0745) Pulse Rate: 67 (12/22 0745)  Labs: Recent Labs    05/25/22 0338 05/25/22 0548  HGB 12.9*  --   HCT 38.1*  --   PLT 301  --   CREATININE 1.42*  --   TROPONINIHS 2,560* 2,580*    Estimated Creatinine Clearance: 33.8 mL/min (A) (by C-G formula based on SCr of 1.42 mg/dL (H)).   Medical History: Past Medical History:  Diagnosis Date   BPH (benign prostatic hypertrophy)    Cataract    bil catarCTS REMOVED   Colon polyps    Diabetes mellitus    Gait abnormality 10/27/2019   Hernia    abdominal/ventral   Memory disorder 10/27/2019   Memory loss    Sleep apnea    last study 5 years ago- per patient [facility].  wears c_pap    Medications:  (Not in a hospital admission)   Assessment: 88 yo M presents with active COVID infection. Pt has been taking apixaban '5mg'$  BID prior to admission for history of LV thrombus diagnosed on 08/25/21. Last dose of apixaban '5mg'$  was given between 18:00-23:00 on 05/24/22 at SNF prior to admission. Pharmacy was consulted to transition and manage pt on heparin infusion per ACS protocol and for h/o LV thrombus.   Goal of Therapy:  Heparin level 0.3-0.7 units/ml aPTT 66-102 seconds Monitor platelets by anticoagulation protocol: Yes   Plan:  Initiate Heparin infusion at 800 units/hr.  No heparin bolus given recent apixaban administration. Check aPTT and heparin level in 8 hours and repeat q8h until therapeutic x2 Monitor daily CBC, aPTT and heparin levels (until correlating with recent apixaban administration) and for s/sx of bleeding.  Luisa Hart, PharmD, BCPS Clinical Pharmacist 05/25/2022 8:30 AM   Please refer to [facility] for pharmacy phone number

## 2022-05-25 NOTE — Progress Notes (Signed)
Spoke to patients daughter Raquel Sarna and reviewed plan of care and patients condition at this time. Questions answered.

## 2022-05-25 NOTE — H&P (Addendum)
History and Physical    Patient: Gregory Lamb CHY:[id number] DOB: July 23, 1933 DOA: 05/25/2022 DOS: the patient was seen and examined on 05/25/2022 PCP: Janie Morning, DO  Patient coming from: [place] [facility] facility via EMS  Chief Complaint:  Chief Complaint  Patient presents with   Shortness of Breath   HPI: Gregory Lamb is a 88 y.o. male with medical history significant of hyperlipidemia, CAD, DM type II, BPH, and advanced dementia who presented with complaints of shortness of breath.  History is mostly obtained from review of records due to the patient having dementia.  He does report being short of breath when asked, but denies any other complaints.  At the facility patient had been complaining of shortness of breath there was concern for COVID because there have been several cases in the facility.  There were reports of the patient complaining of pain in his right arm, strong smell to urine, and pain with urination.  In the emergency department patient was noted to have stable vital signs.  Labs significant for WBC 11.3, hemoglobin 12.9, BUN 29, creatinine 1.42, glucose 165, BNP 271.2, and high-sensitivity troponin 2560-> 2580.  Screening positive for COVID-19.  Chest x-ray was positive for borderline cardiomegaly with mild distended pulmonary vasculature and low lung volumes with atelectasis at the lung bases.  Patient was given full dose aspirin and started on heparin drip.  Patient's CODE STATUS was confirmed with his daughter over the phone as to how resuscitate.  Review of Systems: As mentioned in the history of present illness. All other systems reviewed and are negative. Past Medical History:  Diagnosis Date   BPH (benign prostatic hypertrophy)    Cataract    bil catarCTS REMOVED   Colon polyps    Diabetes mellitus    Gait abnormality 10/27/2019   Hernia    abdominal/ventral   Memory disorder 10/27/2019   Memory loss    Sleep apnea    last study 5 years ago-  per patient SEVERE.  wears c_pap   Past Surgical History:  Procedure Laterality Date   COLONOSCOPY     EYE SURGERY     cataract extraction with IOL   hard of hearing     wear bil haring aids   LIPOMA EXCISION     back   POLYPECTOMY     TONSILLECTOMY     TRANSURETHRAL RESECTION OF PROSTATE  12/12/2011   Procedure: TRANSURETHRAL RESECTION OF THE PROSTATE WITH GYRUS INSTRUMENTS;  Surgeon: Franchot Gallo, MD;  Location: [facility];  Service: Urology;  Laterality: N/A;   UPPER GASTROINTESTINAL ENDOSCOPY     Social History:  reports that he quit smoking about 42 years ago. His smoking use included cigarettes. He has a 50.00 pack-year smoking history. He has quit using smokeless tobacco. He reports current alcohol use of about 7.0 standard drinks of alcohol per week. He reports that he does not use drugs.  No Known Allergies  Family History  Problem Relation Age of Onset   Colon cancer Neg Hx    Esophageal cancer Neg Hx    Liver cancer Neg Hx    Pancreatic cancer Neg Hx    Prostate cancer Neg Hx    Rectal cancer Neg Hx    Stomach cancer Neg Hx     Prior to Admission medications   Medication Sig Start Date End Date Taking? Authorizing Provider  apixaban (ELIQUIS) 5 MG TABS tablet Take 1 tablet (5 mg total) by mouth 2 (two) times daily. 08/31/21  Kathie Dike, MD  clopidogrel (PLAVIX) 75 MG tablet Take 1 tablet (75 mg total) by mouth daily. 09/01/21   Kathie Dike, MD  donepezil (ARICEPT) 10 MG tablet Take 1 tablet (10 mg total) by mouth daily. Patient not taking: Reported on 08/25/2021 10/27/19   Kathrynn Ducking, MD  losartan (COZAAR) 50 MG tablet Take 1 tablet (50 mg total) by mouth daily. Takes 1/2 tablet daily 08/31/21   Kathie Dike, MD  Melatonin 3 MG CAPS Take 3 mg by mouth as needed.  Patient not taking: Reported on 08/25/2021    [provider]  memantine (NAMENDA) 5 MG tablet Take 5 mg by mouth 2 (two) times daily. Patient not taking: Reported on 08/25/2021  08/18/19   [provider]  rosuvastatin (CRESTOR) 10 MG tablet Take 1 tablet (10 mg total) by mouth daily. 09/01/21   Kathie Dike, MD  Tamsulosin HCl (FLOMAX) 0.4 MG CAPS Take 1 capsule (0.4 mg total) by mouth daily after supper. Patient not taking: Reported on 08/25/2021 12/13/11   Franchot Gallo, MD    Physical Exam: Vitals:   05/25/22 0645 05/25/22 0715 05/25/22 0723 05/25/22 0725  BP: 123/61 101/69    Pulse: 67 72  66  Resp: '14 19  14  '$ Temp:   98.4 F (36.9 C)   TempSrc:   Oral   SpO2: 98% 98%  98%  Weight:      Height:       Constitutional: Elderly male currently in no acute distress, but does have a intermittently productive sounding cough Eyes: PERRL, lids and conjunctivae normal ENMT: Mucous membranes are dry.  Fair dentition.  Patient hard of hearing. Neck: normal, supple  Respiratory: Mildly decreased breath sounds without significant wheezes or rhonchi appreciated.  O2 saturations currently maintained on room air. Cardiovascular: Regular rate and rhythm  no lower extremity edema appreciated. Abdomen: no tenderness, no masses palpated. Bowel sounds positive.  Musculoskeletal: no clubbing / cyanosis.  Fairly normal muscle tone. Skin: Bruising noted of the left upper extremity. Neurologic: CN 2-12 grossly intact.  Able to move all extremities. Psychiatric: Alert and oriented to self, but not place or time.  Normal mood.  Data Reviewed:  EKG reveals sinus rhythm at 77 bpm with first-degree heart block, with age-indeterminate inferior infarct and anterolateral infarct.  Reviewed labs, imaging and pertinent records as noted above in HPI.  Assessment and Plan: NSTEMI CAD Acute.  High-sensitivity troponins were flat 2560->2580.  EKG appears relatively similar to the patient's prior in EKG when it mated with STEMI in March of this year thought to have an anterior wall MI with left ventricular mural thrombus where high-sensitivity troponins were elevated up to  16,651.  Evaluated by cardiology at that time, but no intervention recommended to be pursued.  He had been placed on Eliquis, Plavix, and Crestor with plans for medical management.  Cardiology has been consulted for findings today and patient had received full dose aspirin and started on heparin drip. -Admit to a cardiac telemetry bed -Switch heparin to Eliquis -Cardiology consulted, but did not recommend any further workup at this time  Covid-19 infection Patient had complained of shortness of breath and there have been reported an outbreak of COVID-19 at the patient's facility.  COVID-19 screening positive here.  Chest x-ray did not note any acute infiltrate.  O2 saturations currently maintained on room air.  Patient not a candidate for Paxlovid due to being on Eliquis.  Molnupiravir no longer available in the inpatient setting and patient  does not seem to be a candidate for remdesivir -COVID-19 focused order set utilized -Airborne precautions -Check inflammatory markers -Albuterol inhaler -Vitamin C and zinc -Antitussives as needed  Acute kidney injury Patient presents with creatinine elevated up to 1.42 with BUN 29.  Baseline creatinine previously had been around 1.  Suspect likely prerenal cause of symptoms, but also question possibility of obstruction with history of BPH. -Check bladder scan -Check urinalysis -Gentle IV fluids at 50 mL/h for 1 L -Recheck kidney function in a.m.  History of LV thrombus on chronic anticoagulation Noted to have LV thrombus back in March when presented with initial STEMI.  Patient had on Eliquis for treatment. -Resume Eliquis as recommended by cardiology  Chronic heart failure with reduced EF On physical exam patient does not appear grossly fluid overloaded.  Last echocardiogram revealed EF to be 40-45% with normal diastolic parameters back in 08/2021.Chest x-ray noted borderline cardiomegaly with mild distended pulmonary vasculature. -Strict I&O's and  daily weights  Essential hypertension  Patient's blood pressures are currently maintained. -Hold ARB  Diabetes mellitus type 2, without long-term use of insulin Patient is diet controlled.  On admission glucose 165.  Last hemoglobin A1c 6.2 in 08/2021. -Heart healthy carb modified diet -Continue monitor and start sliding scale insulin if blood sugars seem to trend greater than 180 or patient needing to be started on steroids  Advanced dementia Patient appeared only alert and oriented to self at baseline. -Delirium precautions -Bed alarm on  BPH -Check bladder scans -Continue Flomax  GI prophylaxis: Protonix DVT prophylaxis: Heparin Advance Care Planning:   Code Status: DNR    Consults: Cardiology   Severity of Illness: The appropriate patient status for this patient is INPATIENT. Inpatient status is judged to be reasonable and necessary in order to provide the required intensity of service to ensure the patient's safety. The patient's presenting symptoms, physical exam findings, and initial radiographic and laboratory data in the context of their chronic comorbidities is felt to place them at high risk for further clinical deterioration. Furthermore, it is not anticipated that the patient will be medically stable for discharge from the hospital within 2 midnights of admission.   * I certify that at the point of admission it is my clinical judgment that the patient will require inpatient hospital care spanning beyond 2 midnights from the point of admission due to high intensity of service, high risk for further deterioration and high frequency of surveillance required.*  Author: Norval Morton, MD 05/25/2022 7:40 AM  For on call review [email or web address].

## 2022-05-25 NOTE — ED Provider Notes (Signed)
[facility] EMERGENCY DEPARTMENT Provider Note   CSN: [id number] Arrival date & time: 05/25/22  0308     History  Chief Complaint  Patient presents with   Shortness of Breath    Gregory Lamb is a 88 y.o. male.  88 year old male presents the ER today secondary to shortness of breath.  Patient has severe dementia and is able to offer 0 history.  There are some report that he told shortness of breath to his facility and that is why he sent him here.  He is COVID going around the facility.  My exam patient states he feels terrible but otherwise is pan positive review of systems.   Shortness of Breath      Home Medications Prior to Admission medications   Medication Sig Start Date End Date Taking? Authorizing Provider  apixaban (ELIQUIS) 5 MG TABS tablet Take 1 tablet (5 mg total) by mouth 2 (two) times daily. 08/31/21   Erick Blinks, MD  clopidogrel (PLAVIX) 75 MG tablet Take 1 tablet (75 mg total) by mouth daily. 09/01/21   Erick Blinks, MD  donepezil (ARICEPT) 10 MG tablet Take 1 tablet (10 mg total) by mouth daily. Patient not taking: Reported on 08/25/2021 10/27/19   York Spaniel, MD  losartan (COZAAR) 50 MG tablet Take 1 tablet (50 mg total) by mouth daily. Takes 1/2 tablet daily 08/31/21   Erick Blinks, MD  Melatonin 3 MG CAPS Take 3 mg by mouth as needed.  Patient not taking: Reported on 08/25/2021    [provider]  memantine (NAMENDA) 5 MG tablet Take 5 mg by mouth 2 (two) times daily. Patient not taking: Reported on 08/25/2021 08/18/19   [provider]  rosuvastatin (CRESTOR) 10 MG tablet Take 1 tablet (10 mg total) by mouth daily. 09/01/21   Erick Blinks, MD  Tamsulosin HCl (FLOMAX) 0.4 MG CAPS Take 1 capsule (0.4 mg total) by mouth daily after supper. Patient not taking: Reported on 08/25/2021 12/13/11   Marcine Matar, MD      Allergies    Patient has no known allergies.    Review of Systems   Review of  Systems  Respiratory:  Positive for shortness of breath.     Physical Exam Updated Vital Signs BP 123/61   Pulse 67   Temp 98.3 F (36.8 C)   Resp 14   Ht 5\' 8"  (1.727 m)   Wt 64 kg   SpO2 98%   BMI 21.44 kg/m  Physical Exam Vitals and nursing note reviewed.  Constitutional:      Appearance: He is well-developed.  HENT:     Head: Normocephalic and atraumatic.  Cardiovascular:     Rate and Rhythm: Normal rate.  Pulmonary:     Effort: Pulmonary effort is normal. No respiratory distress.  Chest:     Chest wall: No mass or tenderness.  Abdominal:     General: There is no distension.  Musculoskeletal:        General: Normal range of motion.     Cervical back: Normal range of motion.     Right lower leg: No edema.     Left lower leg: No edema.  Skin:    General: Skin is warm and dry.  Neurological:     General: No focal deficit present.     Mental Status: He is alert.     ED Results / Procedures / Treatments   Labs (all labs ordered are listed, but only abnormal results are  displayed) Labs Reviewed  RESP PANEL BY RT-PCR (RSV, FLU A&B, COVID)  RVPGX2 - Abnormal; Notable for the following components:      Result Value   SARS Coronavirus 2 by RT PCR POSITIVE (*)    All other components within normal limits  BASIC METABOLIC PANEL - Abnormal; Notable for the following components:   Glucose, Bld 165 (*)    BUN 29 (*)    Creatinine, Ser 1.42 (*)    GFR, Estimated 48 (*)    All other components within normal limits  CBC - Abnormal; Notable for the following components:   WBC 11.3 (*)    RBC 4.15 (*)    Hemoglobin 12.9 (*)    HCT 38.1 (*)    All other components within normal limits  TROPONIN I (HIGH SENSITIVITY) - Abnormal; Notable for the following components:   Troponin I (High Sensitivity) 2,560 (*)    All other components within normal limits  BRAIN NATRIURETIC PEPTIDE  TROPONIN I (HIGH SENSITIVITY)    EKG EKG Interpretation  Date/Time:  Friday May 25 2022 03:22:45 EST Ventricular Rate:  77 PR Interval:  240 QRS Duration: 72 QT Interval:  362 QTC Calculation: 409 R Axis:   175 Text Interpretation: Sinus rhythm with 1st degree A-V block Low voltage QRS Possible Inferior infarct , age undetermined Possible Anterolateral infarct , age undetermined Abnormal ECG When compared with ECG of 24-Aug-2021 19:07, PREVIOUS ECG IS PRESENT Confirmed by Marily Memos [phone]) on 05/25/2022 3:26:56 AM  Radiology DG Chest 2 View  Result Date: 05/25/2022 CLINICAL DATA:  Shortness of breath. EXAM: CHEST - 2 VIEW COMPARISON:  08/30/2021. FINDINGS: Heart is borderline enlarged and the mediastinal contour is within normal limits. Atherosclerotic calcification of the aorta is noted. The pulmonary vasculature is mildly distended. Lung volumes are low with mild atelectasis at the lung bases. No effusion or pneumothorax. No acute osseous abnormality. IMPRESSION: 1. Borderline cardiomegaly with mildly distended pulmonary vasculature. 2. Low lung volumes with atelectasis at the lung bases. Electronically Signed   By: Thornell Sartorius M.D.   On: 05/25/2022 04:04    Procedures .Critical Care  Performed by: Marily Memos, MD Authorized by: Marily Memos, MD   Critical care provider statement:    Critical care time (minutes):  30   Critical care was necessary to treat or prevent imminent or life-threatening deterioration of the following conditions:  Cardiac failure   Critical care was time spent personally by me on the following activities:  Development of treatment plan with patient or surrogate, discussions with consultants, evaluation of patient's response to treatment, examination of patient, ordering and review of laboratory studies, ordering and review of radiographic studies, ordering and performing treatments and interventions, pulse oximetry, re-evaluation of patient's condition and review of old charts     Medications Ordered in ED Medications  aspirin  chewable tablet 324 mg (324 mg Oral Given 05/25/22 0515)    ED Course/ Medical Decision Making/ A&P                           Medical Decision Making Amount and/or Complexity of Data Reviewed Labs: ordered.  Risk Decision regarding hospitalization.   Patient COVID-positive and 2 troponins that were elevated.  Discussed with daughter on the phone who states that patient would not want any significant intervention but would want medical care as appropriate.  Discussed with cardiology who will see patient but did not proceed need for catheterization secondary to  his chronic medical issues.  Did recommend heparin.  Discussed with hospitalist for admission for NSTEMI.  Final Clinical Impression(s) / ED Diagnoses Final diagnoses:  None    Rx / DC Orders ED Discharge Orders     None         Brennen Camper, Barbara Cower, MD 05/26/22 2308

## 2022-05-25 NOTE — ED Provider Notes (Signed)
Notified at ~0435 that trop is significantly elevated.  COVID positive.  Aspirin given.  Charge RN was notified at the time of my notification and is working on a room.   Montine Circle, PA-C 05/25/22 7460    Merrily Pew, MD 05/25/22 [phone]

## 2022-05-25 NOTE — Consult Note (Signed)
Cardiology Consult:   Patient ID: Gregory Lamb; MRN: [id number]; DOB: 1933-06-21   Admission date: 05/25/2022  Primary Care Provider: Janie Morning, DO Primary Cardiologist: Dr. Ellyn Hack Primary Electrophysiologist:  None  Chief Complaint:  Stat Consult NSTEMI  Patient Profile:   Gregory Lamb is a 88 y.o. male with a history of advanced Cognitive Impairment and gait abnormality Who is presenting for feeling SOB and Covid-19 Positive  History of Present Illness:   Mr. Gregory Lamb is feeling Short of breath.   Patient was seen 08/24/21 in the setting of anterior STEMI.  At that time he was medically managed; he was not a safe candidate for intervention in the setting of his advanced dementia. We was started on ARB, low dose BB limited by bradycardia, no SGLT2i in the setting of infection risk. He had a small apical LV thrombus and started DOAC and plavix. Lost to follow up  Patient has new shortness of breath.  Given multiple cases of COVID-10 he was send for EMS evaluation (also with pain with urination and right arm pain).  ROS has been variable by provider, likely in the setting of dementia.  On my exam patient notes that he is cold.  No chest pain, no SOB, no DOE.  He is hard of hearing.  He does respond to the question "do you have a daugther, who helps with your care," but he does not note her name or other relevant details.   Troponin 2560-> 2580. 08/25/21 Troponin 12,545-> 16,651, 14,686.   Past Medical History:  Diagnosis Date   BPH (benign prostatic hypertrophy)    Cataract    bil catarCTS REMOVED   Colon polyps    Diabetes mellitus    Gait abnormality 10/27/2019   Hernia    abdominal/ventral   Memory disorder 10/27/2019   Memory loss    Sleep apnea    last study 5 years ago- per patient SEVERE.  wears c_pap    Past Surgical History:  Procedure Laterality Date   COLONOSCOPY     EYE SURGERY     cataract extraction with IOL   hard of hearing     wear  bil haring aids   LIPOMA EXCISION     back   POLYPECTOMY     TONSILLECTOMY     TRANSURETHRAL RESECTION OF PROSTATE  12/12/2011   Procedure: TRANSURETHRAL RESECTION OF THE PROSTATE WITH GYRUS INSTRUMENTS;  Surgeon: Franchot Gallo, MD;  Location: [facility];  Service: Urology;  Laterality: N/A;   UPPER GASTROINTESTINAL ENDOSCOPY       Medications Prior to Admission: Prior to Admission medications   Medication Sig Start Date End Date Taking? Authorizing Provider  apixaban (ELIQUIS) 5 MG TABS tablet Take 1 tablet (5 mg total) by mouth 2 (two) times daily. 08/31/21   Kathie Dike, MD  clopidogrel (PLAVIX) 75 MG tablet Take 1 tablet (75 mg total) by mouth daily. 09/01/21   Kathie Dike, MD  donepezil (ARICEPT) 10 MG tablet Take 1 tablet (10 mg total) by mouth daily. Patient not taking: Reported on 08/25/2021 10/27/19   Kathrynn Ducking, MD  losartan (COZAAR) 50 MG tablet Take 1 tablet (50 mg total) by mouth daily. Takes 1/2 tablet daily 08/31/21   Kathie Dike, MD  Melatonin 3 MG CAPS Take 3 mg by mouth as needed.  Patient not taking: Reported on 08/25/2021    [provider]  memantine (NAMENDA) 5 MG tablet Take 5 mg by mouth 2 (two) times daily. Patient not  taking: Reported on 08/25/2021 08/18/19   [provider]  rosuvastatin (CRESTOR) 10 MG tablet Take 1 tablet (10 mg total) by mouth daily. 09/01/21   Kathie Dike, MD  Tamsulosin HCl (FLOMAX) 0.4 MG CAPS Take 1 capsule (0.4 mg total) by mouth daily after supper. Patient not taking: Reported on 08/25/2021 12/13/11   Franchot Gallo, MD     Allergies:   No Known Allergies  Social History:   Social History   Socioeconomic History   Marital status: Married    Spouse name: Not on file   Number of children: 4   Years of education: Not on file   Highest education level: Not on file  Occupational History   Not on file  Tobacco Use   Smoking status: Former    Packs/day: 1.00    Years: 50.00    Total pack years:  50.00    Types: Cigarettes    Quit date: 12/10/1979    Years since quitting: 42.4   Smokeless tobacco: Former   Tobacco comments:    quit in 1980's or 1990's  Vaping Use   Vaping Use: Never used  Substance and Sexual Activity   Alcohol use: Yes    Alcohol/week: 7.0 standard drinks of alcohol    Types: 7 Cans of beer per week    Comment: daily glass wine or beer   Drug use: No   Sexual activity: Not on file  Other Topics Concern   Not on file  Social History Narrative   Not on file   Social Determinants of Health   Financial Resource Strain: Not on file  Food Insecurity: Not on file  Transportation Needs: Not on file  Physical Activity: Not on file  Stress: Not on file  Social Connections: Not on file  Intimate Partner Violence: Not on file    Family History:   The patient's family history is negative for Colon cancer, Esophageal cancer, Liver cancer, Pancreatic cancer, Prostate cancer, Rectal cancer, and Stomach cancer.    ROS:  Please see the history of present illness.   Physical Exam/Data:   Vitals:   05/25/22 0715 05/25/22 0723 05/25/22 0725 05/25/22 0745  BP: 101/69   (!) 109/53  Pulse: 72  66 67  Resp: '19  14 15  '$ Temp:  98.4 F (36.9 C)    TempSrc:  Oral    SpO2: 98%  98% 98%  Weight:      Height:       No intake or output data in the 24 hours ending 05/25/22 0800 Filed Weights   05/25/22 0319  Weight: 64 kg   Body mass index is 21.44 kg/m.   Gen: no distress, elderly male it Cardiac: No Rubs or Gallops, no murmur, RRR +2 radial pulses Respiratory: decreased breath sounds bilaterally, does not inspire on command, normal respiratory rate GI: Soft, nontender, non-distended  MS: No  edema;  moves all extremities Integument: Skin feels warm Neuro:  At time of evaluation, alert and oriented to person Psych: pleasant affect    EKG:  The ECG that was done  was personally reviewed and demonstrates SR with 1st HB and inferior and anterior q  waves TELE: SR  Relevant CV Studies:  Cardiac Studies & Procedures       ECHOCARDIOGRAM  ECHOCARDIOGRAM COMPLETE 08/25/2021  Narrative ECHOCARDIOGRAM REPORT    Patient Name:   Gregory Lamb Date of Exam: 08/25/2021 Medical Rec #:  [id number]        Height:  68.0 in Accession #:    [id number]       Weight:       160.9 lb Date of Birth:  12/06/1933        BSA:          1.863 m Patient Age:    77 years         BP:           159/82 mmHg Patient Gender: M                HR:           71 bpm. Exam Location:  Inpatient  Procedure: 2D Echo and Intracardiac Opacification Agent  Indications:    NSTEMI  History:        Patient has no prior history of Echocardiogram examinations. Risk Factors:Diabetes.  Sonographer:    Johny Chess RDCS Referring Phys: [id number] [place]   1. Left ventricular ejection fraction, by estimation, is 40 to 45%. The left ventricle has mildly decreased function. The left ventricle demonstrates regional wall motion abnormalities (see scoring diagram/findings for description). Left ventricular diastolic parameters were normal. 2. Right ventricular systolic function is normal. The right ventricular size is normal. 3. The mitral valve is grossly normal. Trivial mitral valve regurgitation. No evidence of mitral stenosis. 4. The aortic valve was not well visualized. There is mild calcification of the aortic valve. There is mild thickening of the aortic valve. Aortic valve regurgitation is not visualized. No aortic stenosis is present. 5. The inferior vena cava is normal in size with greater than 50% respiratory variability, suggesting right atrial pressure of 3 mmHg.  Comparison(s): No prior Echocardiogram.  Conclusion(s)/Recommendation(s): Focal wall motion abnormalities in the mid to apical septal walls and peri-apical portions of the anterior and lateral walls. There is a small adherent LV thrombus seen (see image 80) at the  apical anteroseptum. Findings communicated with Dr. Louanne Belton and the cardiology rounding team.  FINDINGS Left Ventricle: Left ventricular ejection fraction, by estimation, is 40 to 45%. The left ventricle has mildly decreased function. The left ventricle demonstrates regional wall motion abnormalities. Definity contrast agent was given IV to delineate the left ventricular endocardial borders. The left ventricular internal cavity size was normal in size. There is no left ventricular hypertrophy. Left ventricular diastolic parameters were normal.   LV Wall Scoring: The apical septal segment, apical inferior segment, and apex are akinetic. The mid anteroseptal segment, apical lateral segment, mid inferoseptal segment, apical anterior segment, and mid inferior segment are hypokinetic. The anterior wall, antero-lateral wall, posterior wall, basal anteroseptal segment, basal inferior segment, and basal inferoseptal segment are normal.  Right Ventricle: The right ventricular size is normal. No increase in right ventricular wall thickness. Right ventricular systolic function is normal.  Left Atrium: Left atrial size was normal in size.  Right Atrium: Right atrial size was normal in size.  Pericardium: There is no evidence of pericardial effusion. Presence of epicardial fat layer.  Mitral Valve: The mitral valve is grossly normal. There is mild thickening of the mitral valve leaflet(s). There is mild calcification of the mitral valve leaflet(s). Trivial mitral valve regurgitation. No evidence of mitral valve stenosis.  Tricuspid Valve: The tricuspid valve is grossly normal. Tricuspid valve regurgitation is trivial. No evidence of tricuspid stenosis.  Aortic Valve: The aortic valve was not well visualized. There is mild calcification of the aortic valve. There is mild thickening of the aortic valve. Aortic valve regurgitation is not visualized. No  aortic stenosis is present.  Pulmonic Valve: The  pulmonic valve was not well visualized. Pulmonic valve regurgitation is not visualized.  Aorta: The aortic root, ascending aorta and aortic arch are all structurally normal, with no evidence of dilitation or obstruction.  Venous: The inferior vena cava is normal in size with greater than 50% respiratory variability, suggesting right atrial pressure of 3 mmHg.  IAS/Shunts: The interatrial septum was not well visualized.   LEFT VENTRICLE PLAX 2D LVIDd:         4.00 cm   Diastology LVIDs:         2.90 cm   LV e' medial:    6.83 cm/s LV PW:         1.00 cm   LV E/e' medial:  9.7 LV IVS:        1.00 cm   LV e' lateral:   7.43 cm/s LVOT diam:     2.10 cm   LV E/e' lateral: 8.9 LV SV:         64 LV SV Index:   34 LVOT Area:     3.46 cm   RIGHT VENTRICLE             IVC RV S prime:     12.20 cm/s  IVC diam: 1.20 cm TAPSE (M-mode): 1.3 cm  LEFT ATRIUM             Index        RIGHT ATRIUM          Index LA diam:        3.80 cm 2.04 cm/m   RA Area:     9.50 cm LA Vol (A2C):   26.4 ml 14.17 ml/m  RA Volume:   16.30 ml 8.75 ml/m LA Vol (A4C):   26.7 ml 14.33 ml/m LA Biplane Vol: 28.4 ml 15.24 ml/m AORTIC VALVE LVOT Vmax:   94.90 cm/s LVOT Vmean:  62.400 cm/s LVOT VTI:    0.185 m  AORTA Ao Root diam: 3.10 cm Ao Asc diam:  3.20 cm  MITRAL VALVE MV Area (PHT): 3.99 cm     SHUNTS MV Decel Time: 190 msec     Systemic VTI:  0.18 m MV E velocity: 66.00 cm/s   Systemic Diam: 2.10 cm MV A velocity: 124.00 cm/s MV E/A ratio:  0.53  Buford Dresser MD Electronically signed by Buford Dresser MD Signature Date/Time: 08/25/2021/3:44:55 PM    Final               Laboratory Data:  Chemistry Recent Labs  Lab 05/25/22 0338  NA 144  K 4.1  CL 109  CO2 25  GLUCOSE 165*  BUN 29*  CREATININE 1.42*  CALCIUM 9.9  GFRNONAA 48*  ANIONGAP 10    No results for input(s): "PROT", "ALBUMIN", "AST", "ALT", "ALKPHOS", "BILITOT" in the last 168  hours. Hematology Recent Labs  Lab 05/25/22 0338  WBC 11.3*  RBC 4.15*  HGB 12.9*  HCT 38.1*  MCV 91.8  MCH 31.1  MCHC 33.9  RDW 13.0  PLT 301   Cardiac EnzymesNo results for input(s): "TROPONINI" in the last 168 hours. No results for input(s): "TROPIPOC" in the last 168 hours.  BNP Recent Labs  Lab 05/25/22 0548  BNP 271.2*    DDimer No results for input(s): "DDIMER" in the last 168 hours.  Radiology/Studies:  DG Chest 2 View  Result Date: 05/25/2022 CLINICAL DATA:  Shortness of breath. EXAM: CHEST - 2 VIEW COMPARISON:  08/30/2021. FINDINGS:  Heart is borderline enlarged and the mediastinal contour is within normal limits. Atherosclerotic calcification of the aorta is noted. The pulmonary vasculature is mildly distended. Lung volumes are low with mild atelectasis at the lung bases. No effusion or pneumothorax. No acute osseous abnormality. IMPRESSION: 1. Borderline cardiomegaly with mildly distended pulmonary vasculature. 2. Low lung volumes with atelectasis at the lung bases. Electronically Signed   By: Brett Fairy M.D.   On: 05/25/2022 04:04    Assessment and Plan:   NSTEMI - suspect Type II Demand on the back ground of suspected underlying disease - complicated by dementia: Given age and advanced dementia, decision was made not to take to the Cath Lab.  It does not appear it his dementia has markely improved  - will start heparin presently, ASA and ASA given and continue plavix - if COVID-19 therapies are not pursued, would transition back from heparin to eliquis for LV thrombus and concerns he would not be able to follow well on coumadin   Ischemic cardiomyopathy: 1st HB and bradycardia -  EF 40 to 45% following suspected anterior STEMI in March - holding BB and ARB; Suspect BB will not be added back - Given limited treatment options, unclear that repeat echocardiogram will change management with the exception of coumadin discussion if fails DOAC, or if a GOC  discussion is planned   LV thrombus:  - heparin to eliquis depending on COVID-19 therapy    Per primary COVID-19 Advanced Dementia DM Lactic acidosis BPH Query of UTI  For questions or updates, please contact [facility] Please consult [email or web address] for contact info under Cardiology/STEMI.   Rudean Haskell, MD [address] [place], [place] [address] [phone]  8:00 AM   TIMI Risk Score for Unstable Angina or Non-ST Elevation MI:   The patient's TIMI risk score is 3, which indicates a 13% risk of all cause mortality, new or recurrent myocardial infarction or need for urgent revascularization in the next 14 days.

## 2022-05-25 NOTE — ED Triage Notes (Signed)
Pt to ED from [facility]. Per EMS, facility states that pt has had shortness of breath and they were concerned for covid because they have several covid cases at the facility. Pt c/o right arm pain, strong urine odor and pain w/urination with EMS.  120/68 HR=88 96% RA Cbg=232 99.1

## 2022-05-25 NOTE — ED Notes (Signed)
Pt found to have removed monitoring equipment and IV. Pt placed back on cardiac monitor. Another IV placed.

## 2022-05-25 NOTE — ED Provider Triage Note (Signed)
  Emergency Medicine Provider Triage Evaluation Note  MRN:  [id number]  Arrival date & time: 05/25/22    Medically screening exam initiated at 3:24 AM.   CC:   Shortness of Breath   HPI:  Gregory Lamb is a 88 y.o. year-old male presents to the ED with chief complaint of SOB.  Also complains of having had some chest pain.  Has been around others with COVID.  Denies pain now.  History provided by patient. ROS:  -As included in HPI PE:   Vitals:   05/25/22 0315  BP: 114/72  Pulse: 76  Resp: 18  Temp: 98.3 F (36.8 C)  SpO2: 95%    Non-toxic appearing No respiratory distress  MDM:  Based on signs and symptoms, COVID is highest on my differential, followed by flu. I've ordered labs in triage to expedite lab/diagnostic workup.  Patient was informed that the remainder of the evaluation will be completed by another provider, this initial triage assessment does not replace that evaluation, and the importance of remaining in the ED until their evaluation is complete.    Montine Circle, PA-C 05/25/22 [phone]

## 2022-05-25 NOTE — ED Notes (Signed)
Mr. Ybarra daughter called, would like update on her father. [phone]

## 2022-05-26 DIAGNOSIS — I214 Non-ST elevation (NSTEMI) myocardial infarction: Secondary | ICD-10-CM | POA: Diagnosis not present

## 2022-05-26 LAB — CBC
HCT: 34.7 % — ABNORMAL LOW (ref 39.0–52.0)
Hemoglobin: 11.2 g/dL — ABNORMAL LOW (ref 13.0–17.0)
MCH: 30.5 pg (ref 26.0–34.0)
MCHC: 32.3 g/dL (ref 30.0–36.0)
MCV: 94.6 fL (ref 80.0–100.0)
Platelets: 252 10*3/uL (ref 150–400)
RBC: 3.67 MIL/uL — ABNORMAL LOW (ref 4.22–5.81)
RDW: 13 % (ref 11.5–15.5)
WBC: 10.2 10*3/uL (ref 4.0–10.5)
nRBC: 0 % (ref 0.0–0.2)

## 2022-05-26 LAB — BASIC METABOLIC PANEL
Anion gap: 5 (ref 5–15)
BUN: 29 mg/dL — ABNORMAL HIGH (ref 8–23)
CO2: 24 mmol/L (ref 22–32)
Calcium: 9 mg/dL (ref 8.9–10.3)
Chloride: 112 mmol/L — ABNORMAL HIGH (ref 98–111)
Creatinine, Ser: 1.16 mg/dL (ref 0.61–1.24)
GFR, Estimated: >60 mL/min (ref >60)
Glucose, Bld: 124 mg/dL — ABNORMAL HIGH (ref 70–99)
Potassium: 3.6 mmol/L (ref 3.5–5.1)
Sodium: 141 mmol/L (ref 135–145)

## 2022-05-26 LAB — GLUCOSE, CAPILLARY
Glucose-Capillary: 111 mg/dL — ABNORMAL HIGH (ref 70–99)
Glucose-Capillary: 87 mg/dL (ref 70–99)
Glucose-Capillary: 161 mg/dL — ABNORMAL HIGH (ref 70–99)

## 2022-05-26 MED ORDER — ROSUVASTATIN CALCIUM 20 MG PO TABS
20.0000 mg | ORAL_TABLET | Freq: Every day | ORAL | Status: DC
  Administered 2022-05-27: 20 mg via ORAL
  Filled 2022-05-26: qty 1

## 2022-05-26 MED ORDER — HALOPERIDOL LACTATE 5 MG/ML IJ SOLN
1.0000 mg | Freq: Once | INTRAMUSCULAR | Status: AC | PRN
  Administered 2022-05-27: 1 mg via INTRAVENOUS
  Filled 2022-05-26: qty 1

## 2022-05-26 NOTE — Progress Notes (Signed)
Mobility Specialist - Progress Note   05/26/22 1312  Mobility  Activity Stood at bedside  Level of Assistance Minimal assist, patient does 75% or more  Assistive Device None  Distance Ambulated (ft) 2 ft  Activity Response Tolerated well  Mobility Referral Yes  $Mobility charge 1 Mobility   Pt received standing at bedside. Pt able to take step towards head of bed. Pt left in bed with all needs met and bed alarm on. RN notified.   Franki Monte  Mobility Specialist Please contact via Solicitor or Rehab office at [phone]

## 2022-05-26 NOTE — Progress Notes (Signed)
Mobility Specialist - Progress Note   05/26/22 0910  Mobility  Activity Stood at bedside;Ambulated with assistance in room  Level of Assistance Minimal assist, patient does 75% or more  Assistive Device None  Distance Ambulated (ft) 2 ft  Activity Response Tolerated well  Mobility Referral Yes  $Mobility charge 1 Mobility   Pt was received standing EOB after setting off bed alarm. Pt stating the he had to pee and not aware that he was hooked up to a male purewick. Pt needed MinA to take step towards the head of bed with multiple vocal cues. Pt was left in bed with all needs met and NT present.   Franki Monte  Mobility Specialist Please contact via Solicitor or Rehab office at [phone]

## 2022-05-26 NOTE — Evaluation (Signed)
Physical Therapy Evaluation Patient Details Name: Gregory Lamb MRN: [id number] DOB: 12-27-1933 Today's Date: 05/26/2022  History of Present Illness  88 y.o. male who presented 05/25/22 with complaints of shortness of breath. +NSTEMI , +COVID; PMH significant of hyperlipidemia, CAD, DM type II, BPH, and advanced dementia who presented with complaints of shortness of breath.  Clinical Impression   Pt admitted secondary to problem above with deficits below. Per step-daughter (who arrived to visit at end of session), PTA patient was walking modified independently to bathroom with RW. He was unsteady and undergoing PT.  Pt currently requires min assist for balance and walker maneuvering.  Anticipate patient will benefit from PT to address problems listed below.Will continue to follow acutely to maximize functional mobility independence and safety.          Recommendations for follow up therapy are one component of a multi-disciplinary discharge planning process, led by the attending physician.  Recommendations may be updated based on patient status, additional functional criteria and insurance authorization.  Follow Up Recommendations Home health PT (unless facility has their own inhouse therapist)      Assistance Recommended at Discharge Frequent or constant Supervision/Assistance  Patient can return home with the following  A little help with walking and/or transfers;Direct supervision/assist for medications management;Direct supervision/assist for financial management;Assistance with cooking/housework;Assist for transportation    Equipment Recommendations None recommended by PT  Recommendations for Other Services       Functional Status Assessment Patient has had a recent decline in their functional status and demonstrates the ability to make significant improvements in function in a reasonable and predictable amount of time.     Precautions / Restrictions Precautions Precautions:  Fall Precaution Comments: has scraped up rt knee with ?recent fall      Mobility  Bed Mobility Overal bed mobility: Needs Assistance Bed Mobility: Supine to Sit, Sit to Supine     Supine to sit: Min assist Sit to supine: Min assist   General bed mobility comments: pt reaching for a hand to raise torso up to full sitting; required assist with second leg coming up onto bed    Transfers Overall transfer level: Needs assistance Equipment used: Rolling walker (2 wheels) Transfers: Sit to/from Stand Sit to Stand: Min assist           General transfer comment: pt pulls up on RW and required assist to anchor RW; braces his legs against the bed rail to help leverage himself up to standing    Ambulation/Gait Ambulation/Gait assistance: Min assist Gait Distance (Feet): 50 Feet Assistive device: Rolling walker (2 wheels) Gait Pattern/deviations: Step-through pattern, Decreased stride length, Shuffle   Gait velocity interpretation: 1.31 - 2.62 ft/sec, indicative of limited community ambulator   General Gait Details: pt with very short shuffling steps, but able to incr step length and foot clearance with cues to take larger steps; required assist maneuvering RW in tight spaces in room  Stairs            Wheelchair Mobility    Modified Rankin (Stroke Patients Only)       Balance Overall balance assessment: Mild deficits observed, not formally tested                                           Pertinent Vitals/Pain Pain Assessment Pain Assessment: Faces Faces Pain Scale: No hurt    Home Living  Family/patient expects to be discharged to:: Assisted living                 Home Equipment: Shower seat;Grab bars - tub/shower;Rolling Environmental consultant (2 wheels) Additional Comments: Pt resides in memory care unit    Prior Function Prior Level of Function : Needs assist             Mobility Comments: walks with RW from his bed to bathroom modified  independent; was working with PT (per step-daughter)       Hand Dominance        Extremity/Trunk Assessment   Upper Extremity Assessment Upper Extremity Assessment: Overall WFL for tasks assessed    Lower Extremity Assessment Lower Extremity Assessment: Overall WFL for tasks assessed    Cervical / Trunk Assessment Cervical / Trunk Assessment: Normal  Communication   Communication: HOH  Cognition Arousal/Alertness: Awake/alert Behavior During Therapy: Flat affect Overall Cognitive Status: No family/caregiver present to determine baseline cognitive functioning                                 General Comments: followed all commands, including cues to improve his gait pattern        General Comments General comments (skin integrity, edema, etc.): Pt could not state his name, however RN reports he did earlier and knew his birthday    Exercises     Assessment/Plan    PT Assessment Patient needs continued PT services  PT Problem List Decreased balance;Decreased mobility;Decreased cognition;Decreased safety awareness;Decreased knowledge of use of DME;Decreased knowledge of precautions       PT Treatment Interventions DME instruction;Gait training;Functional mobility training;Therapeutic activities;Balance training;Cognitive remediation;Patient/family education    PT Goals (Current goals can be found in the Care Plan section)  Acute Rehab PT Goals Patient Stated Goal: unable to state PT Goal Formulation: Patient unable to participate in goal setting Time For Goal Achievement: 06/09/22 Potential to Achieve Goals: Fair    Frequency Min 2X/week     Co-evaluation               AM-PAC PT "6 Clicks" Mobility  Outcome Measure Help needed turning from your back to your side while in a flat bed without using bedrails?: None Help needed moving from lying on your back to sitting on the side of a flat bed without using bedrails?: A Little Help needed  moving to and from a bed to a chair (including a wheelchair)?: A Little Help needed standing up from a chair using your arms (e.g., wheelchair or bedside chair)?: A Little Help needed to walk in hospital room?: A Little Help needed climbing 3-5 steps with a railing? : A Little 6 Click Score: 19    End of Session Equipment Utilized During Treatment: Gait belt Activity Tolerance: Patient tolerated treatment well Patient left: in bed;with call bell/phone within reach;with bed alarm set Nurse Communication: Mobility status PT Visit Diagnosis: Other abnormalities of gait and mobility (R26.89)    Time: 1207-1232 PT Time Calculation (min) (ACUTE ONLY): 25 min   Charges:   PT Evaluation $PT Eval Low Complexity: 1 Low PT Treatments $Gait Training: 8-22 mins         [facility]  Office [phone]   Rexanne Mano 05/26/2022, 1:06 PM

## 2022-05-26 NOTE — Progress Notes (Signed)
PROGRESS NOTE    Gregory Lamb  PFX:[id number] DOB: 05-15-1934 DOA: 05/25/2022 PCP: Janie Morning, DO  Outpatient Specialists:     Brief Narrative:  As per H&P done on admission: " Gregory Lamb is a 88 y.o. male with medical history significant of hyperlipidemia, CAD, DM type II, BPH, and advanced dementia who presented with complaints of shortness of breath.  History is mostly obtained from review of records due to the patient having dementia.  He does report being short of breath when asked, but denies any other complaints.  At the facility patient had been complaining of shortness of breath there was concern for COVID because there have been several cases in the facility.  There were reports of the patient complaining of pain in his right arm, strong smell to urine, and pain with urination.   In the emergency department patient was noted to have stable vital signs.  Labs significant for WBC 11.3, hemoglobin 12.9, BUN 29, creatinine 1.42, glucose 165, BNP 271.2, and high-sensitivity troponin 2560-> 2580.  Screening positive for COVID-19.  Chest x-ray was positive for borderline cardiomegaly with mild distended pulmonary vasculature and low lung volumes with atelectasis at the lung bases.  Patient was given full dose aspirin and started on heparin drip.  Patient's CODE STATUS was confirmed with his daughter over the phone as to how resuscitate".  05/26/2022: Patient seen.  No new complaints.  No shortness of breath.  Cardiology input is appreciated.   Assessment & Plan:   Principal Problem:   NSTEMI (non-ST elevated myocardial infarction) ([place]) Active Problems:   BPH (benign prostatic hyperplasia)   DM2 (diabetes mellitus, type 2) (HCC)   Advanced dementia ([place])   Coronary artery disease involving native coronary artery of native heart with angina pectoris (HCC)   LV (left ventricular) mural thrombus   COVID-19 virus infection   AKI (acute kidney injury) (H[facility])   Chronic  anticoagulation   Heart failure with reduced ejection fraction ([place])   Essential hypertension   NSTEMI CAD Acute.  High-sensitivity troponins were flat 2560->2580.  EKG appears relatively similar to the patient's prior in EKG when it mated with STEMI in March of this year thought to have an anterior wall MI with left ventricular mural thrombus where high-sensitivity troponins were elevated up to 16,651.  Evaluated by cardiology at that time, but no intervention recommended to be pursued.  He had been placed on Eliquis, Plavix, and Crestor with plans for medical management.  Cardiology has been consulted for findings today and patient had received full dose aspirin and started on heparin drip. -Admit to a cardiac telemetry bed -Switch heparin to Eliquis -Cardiology consulted, but did not recommend any further workup at this time 05/26/2022: Cardiology team is directing care.   Covid-19 infection Patient had complained of shortness of breath and there have been reported an outbreak of COVID-19 at the patient's facility.  COVID-19 screening positive here.  Chest x-ray did not note any acute infiltrate.  O2 saturations currently maintained on room air.  Patient not a candidate for Paxlovid due to being on Eliquis.  Molnupiravir no longer available in the inpatient setting and patient does not seem to be a candidate for remdesivir -COVID-19 focused order set utilized -Airborne precautions -Check inflammatory markers -Albuterol inhaler -Vitamin C and zinc -Antitussives as needed 05/26/2022: Inflammatory markers are not significantly elevated.  Will continue to manage supportively.  No shortness of breath today.  No fever.   Acute kidney injury Patient presents with creatinine  elevated up to 1.42 with BUN 29.  Baseline creatinine previously had been around 1.  Suspect likely prerenal cause of symptoms, but also question possibility of obstruction with history of BPH. -Check bladder scan -Check  urinalysis -Gentle IV fluids at 50 mL/h for 1 L -Recheck kidney function in a.m. 05/26/2022: AKI has resolved.  Serum creatinine is 1.16 today.   History of LV thrombus on chronic anticoagulation Noted to have LV thrombus back in March when presented with initial STEMI.  Patient had on Eliquis for treatment. -Resume Eliquis as recommended by cardiology 05/26/2022: Continue anticoagulation.   Chronic heart failure with reduced EF On physical exam patient does not appear grossly fluid overloaded.  Last echocardiogram revealed EF to be 40-45% with normal diastolic parameters back in 08/2021.Chest x-ray noted borderline cardiomegaly with mild distended pulmonary vasculature. -Strict I&O's and daily weights 05/26/2022: Seems compensated.  Cardiology input is appreciated.   Essential hypertension  Patient's blood pressures are currently maintained. -Hold ARB   Diabetes mellitus type 2, without long-term use of insulin Patient is diet controlled.  On admission glucose 165.  Last hemoglobin A1c 6.2 in 08/2021. -Heart healthy carb modified diet -Continue monitor and start sliding scale insulin if blood sugars seem to trend greater than 180 or patient needing to be started on steroids   Advanced dementia Patient appeared only alert and oriented to self at baseline. -Delirium precautions -Bed alarm on   BPH -Check bladder scans -Continue Flomax     DVT prophylaxis: Apixaban Code Status: DO NOT RESUSCITATE Family Communication:  Disposition Plan: This will depend on hospital course.   Consultants:  Cardiology  Procedures:  None  Antimicrobials:  None   Subjective: No shortness of breath. No fever or chills. Due to dementing illness, patient started particularly good historian.  Objective: Vitals:   05/25/22 2300 05/26/22 0408 05/26/22 0901 05/26/22 1412  BP: 107/64 113/60 106/60 (!) 120/57  Pulse:  63 61 70  Resp:  '19 15 17  '$ Temp:  98.5 F (36.9 C) 98.6 F (37 C)  98.6 F (37 C)  TempSrc:  Oral Oral Oral  SpO2:  99% 92% 99%  Weight:  68.3 kg    Height:        Intake/Output Summary (Last 24 hours) at 05/26/2022 1510 Last data filed at 05/26/2022 0500 Gross per 24 hour  Intake 737.46 ml  Output 300 ml  Net 437.46 ml   Filed Weights   05/25/22 0319 05/25/22 1633 05/26/22 0408  Weight: 64 kg 65 kg 68.3 kg    Examination:  General exam: Appears calm and comfortable  Respiratory system: Clear to auscultation. Respiratory effort normal. Cardiovascular system: S1 & S2 heard Gastrointestinal system: Abdomen is soft and nontender.  Central nervous system: Awake and alert.  Patient moves all extremities.   Extremities: No leg edema.  Data Reviewed: I have personally reviewed following labs and imaging studies  CBC: Recent Labs  Lab 05/25/22 0338 05/26/22 0134  WBC 11.3* 10.2  HGB 12.9* 11.2*  HCT 38.1* 34.7*  MCV 91.8 94.6  PLT 301 701   Basic Metabolic Panel: Recent Labs  Lab 05/25/22 0338 05/26/22 0134  NA 144 141  K 4.1 3.6  CL 109 112*  CO2 25 24  GLUCOSE 165* 124*  BUN 29* 29*  CREATININE 1.42* 1.16  CALCIUM 9.9 9.0   GFR: Estimated Creatinine Clearance: 44.2 mL/min (by C-G formula based on SCr of 1.16 mg/dL). Liver Function Tests: No results for input(s): "AST", "ALT", "ALKPHOS", "BILITOT", "PROT", "  ALBUMIN" in the last 168 hours. No results for input(s): "LIPASE", "AMYLASE" in the last 168 hours. No results for input(s): "AMMONIA" in the last 168 hours. Coagulation Profile: No results for input(s): "INR", "PROTIME" in the last 168 hours. Cardiac Enzymes: No results for input(s): "CKTOTAL", "CKMB", "CKMBINDEX", "TROPONINI" in the last 168 hours. BNP (last 3 results) No results for input(s): "PROBNP" in the last 8760 hours. HbA1C: No results for input(s): "HGBA1C" in the last 72 hours. CBG: Recent Labs  Lab 05/25/22 1706 05/25/22 1945 05/26/22 0857 05/26/22 1253  GLUCAP 81 73 111* 87   Lipid Profile: No  results for input(s): "CHOL", "HDL", "LDLCALC", "TRIG", "CHOLHDL", "LDLDIRECT" in the last 72 hours. Thyroid Function Tests: No results for input(s): "TSH", "T4TOTAL", "FREET4", "T3FREE", "THYROIDAB" in the last 72 hours. Anemia Panel: No results for input(s): "VITAMINB12", "FOLATE", "FERRITIN", "TIBC", "IRON", "RETICCTPCT" in the last 72 hours. Urine analysis:    Component Value Date/Time   COLORURINE YELLOW 05/25/2022 0837   APPEARANCEUR HAZY (A) 05/25/2022 0837   LABSPEC 1.026 05/25/2022 0837   PHURINE 5.0 05/25/2022 0837   GLUCOSEU NEGATIVE 05/25/2022 0837   HGBUR LARGE (A) 05/25/2022 0837   BILIRUBINUR NEGATIVE 05/25/2022 0837   KETONESUR NEGATIVE 05/25/2022 0837   PROTEINUR 100 (A) 05/25/2022 0837   NITRITE NEGATIVE 05/25/2022 0837   LEUKOCYTESUR NEGATIVE 05/25/2022 0837   Sepsis Labs: '@LABRCNTIP'$ (procalcitonin:4,lacticidven:4)  ) Recent Results (from the past 240 hour(s))  Resp panel by RT-PCR (RSV, Flu A&B, Covid) Anterior Nasal Swab     Status: Abnormal   Collection Time: 05/25/22  3:24 AM   Specimen: Anterior Nasal Swab  Result Value Ref Range Status   SARS Coronavirus 2 by RT PCR POSITIVE (A) NEGATIVE Final    Comment: (NOTE) SARS-CoV-2 target nucleic acids are DETECTED.  The SARS-CoV-2 RNA is generally detectable in upper respiratory specimens during the acute phase of infection. Positive results are indicative of the presence of the identified virus, but do not rule out bacterial infection or co-infection with other pathogens not detected by the test. Clinical correlation with patient history and other diagnostic information is necessary to determine patient infection status. The expected result is Negative.  Fact Sheet for Patients: EntrepreneurPulse.com.au  Fact Sheet for Healthcare Providers: IncredibleEmployment.be  This test is not yet approved or cleared by the [place] FDA and  has been authorized for detection  and/or diagnosis of SARS-CoV-2 by FDA under an Emergency Use Authorization (EUA).  This EUA will remain in effect (meaning this test can be used) for the duration of  the COVID-19 declaration under Section 564(b)(1) of the A ct, 21 U.S.C. section 360bbb-3(b)(1), unless the authorization is terminated or revoked sooner.     Influenza A by PCR NEGATIVE NEGATIVE Final   Influenza B by PCR NEGATIVE NEGATIVE Final    Comment: (NOTE) The Xpert Xpress SARS-CoV-2/FLU/RSV plus assay is intended as an aid in the diagnosis of influenza from Nasopharyngeal swab specimens and should not be used as a sole basis for treatment. Nasal washings and aspirates are unacceptable for Xpert Xpress SARS-CoV-2/FLU/RSV testing.  Fact Sheet for Patients: EntrepreneurPulse.com.au  Fact Sheet for Healthcare Providers: IncredibleEmployment.be  This test is not yet approved or cleared by the [place] FDA and has been authorized for detection and/or diagnosis of SARS-CoV-2 by FDA under an Emergency Use Authorization (EUA). This EUA will remain in effect (meaning this test can be used) for the duration of the COVID-19 declaration under Section 564(b)(1) of the Act, 21 U.S.C. section 360bbb-3(b)(1), unless  the authorization is terminated or revoked.     Resp Syncytial Virus by PCR NEGATIVE NEGATIVE Final    Comment: (NOTE) Fact Sheet for Patients: EntrepreneurPulse.com.au  Fact Sheet for Healthcare Providers: IncredibleEmployment.be  This test is not yet approved or cleared by the [place] FDA and has been authorized for detection and/or diagnosis of SARS-CoV-2 by FDA under an Emergency Use Authorization (EUA). This EUA will remain in effect (meaning this test can be used) for the duration of the COVID-19 declaration under Section 564(b)(1) of the Act, 21 U.S.C. section 360bbb-3(b)(1), unless the authorization is terminated  or revoked.  Performed at [facility], [place] [address] [place], [place] [address]          Radiology Studies: DG Chest 2 View  Result Date: 05/25/2022 CLINICAL DATA:  Shortness of breath. EXAM: CHEST - 2 VIEW COMPARISON:  08/30/2021. FINDINGS: Heart is borderline enlarged and the mediastinal contour is within normal limits. Atherosclerotic calcification of the aorta is noted. The pulmonary vasculature is mildly distended. Lung volumes are low with mild atelectasis at the lung bases. No effusion or pneumothorax. No acute osseous abnormality. IMPRESSION: 1. Borderline cardiomegaly with mildly distended pulmonary vasculature. 2. Low lung volumes with atelectasis at the lung bases. Electronically Signed   By: Brett Fairy M.D.   On: 05/25/2022 04:04        Scheduled Meds:  albuterol  2 puff Inhalation Q6H   apixaban  5 mg Oral BID   vitamin C  500 mg Oral Daily   clopidogrel  75 mg Oral Daily   donepezil  10 mg Oral Daily   loxapine  5 mg Oral q AM   melatonin  3 mg Oral QHS   memantine  10 mg Oral BID   pantoprazole  40 mg Oral Daily   [START ON 05/27/2022] rosuvastatin  20 mg Oral Daily   sodium chloride flush  3 mL Intravenous Q12H   tamsulosin  0.4 mg Oral QPC supper   zinc sulfate  220 mg Oral Daily   Continuous Infusions:   LOS: 1 day    Time spent: 35 minutes.    Dana Allan, MD  [facility] Pager #: [phone] 7PM-7AM contact night coverage as above

## 2022-05-26 NOTE — Progress Notes (Signed)
Patient continually removing cardiac monitor, standing at the side of the bed and verbalizing refusal to cooperate with staff in medical treatment. Unable to understand need for cardiac monitoring or comprehend his medical condition of needs for treatment. Mittens applied at times but he readily removes then and they are not helpful. Staff helping each other by taking turns answering when bed alarm goes off. Pads next to the bed for patient safety. Remains free from injury with safety steps taken this shift. Report to [facility]. Huddled with staff and telesitter would not be effective as patient is extremely hard of hearing and would not hear the telemonitor. No sitters available at the time. Will continue with plan and huddle with night shift regarding safety plan.

## 2022-05-26 NOTE — Progress Notes (Signed)
Chart reviewed, patient not seen in setting of active covid 19 infection.   Prior team recommended medical management of NSTEMI as well as continued treatment of LV thrombus. Reasonable to continue IV heparin for at least 48 hours, and transition to eliquis when able. Hold BB given bradycardia.   Cardiology will follow as needed.

## 2022-05-27 DIAGNOSIS — I214 Non-ST elevation (NSTEMI) myocardial infarction: Secondary | ICD-10-CM | POA: Diagnosis not present

## 2022-05-27 LAB — CBC
HCT: 34.3 % — ABNORMAL LOW (ref 39.0–52.0)
Hemoglobin: 11.6 g/dL — ABNORMAL LOW (ref 13.0–17.0)
MCH: 31.2 pg (ref 26.0–34.0)
MCHC: 33.8 g/dL (ref 30.0–36.0)
MCV: 92.2 fL (ref 80.0–100.0)
Platelets: 254 10*3/uL (ref 150–400)
RBC: 3.72 MIL/uL — ABNORMAL LOW (ref 4.22–5.81)
RDW: 13 % (ref 11.5–15.5)
WBC: 6.7 10*3/uL (ref 4.0–10.5)
nRBC: 0 % (ref 0.0–0.2)

## 2022-05-27 LAB — GLUCOSE, CAPILLARY
Glucose-Capillary: 102 mg/dL — ABNORMAL HIGH (ref 70–99)
Glucose-Capillary: 153 mg/dL — ABNORMAL HIGH (ref 70–99)
Glucose-Capillary: 116 mg/dL — ABNORMAL HIGH (ref 70–99)

## 2022-05-27 MED ORDER — ROSUVASTATIN CALCIUM 20 MG PO TABS: 20.0000 mg | ORAL_TABLET | Freq: Every day | ORAL | 1 refills | Status: AC

## 2022-05-27 MED ORDER — ALBUTEROL SULFATE HFA 108 (90 BASE) MCG/ACT IN AERS: 2.0000 | INHALATION_SPRAY | Freq: Four times a day (QID) | RESPIRATORY_TRACT | 0 refills | Status: AC

## 2022-05-27 NOTE — Progress Notes (Signed)
Mobility Specialist - Progress Note   05/27/22 1330  Mobility  Activity Stood at bedside  Level of Assistance Contact guard assist, steadying assist  Assistive Device Other (Comment) (HHA)  Activity Response Tolerated well  Mobility Referral Yes  $Mobility charge 1 Mobility   Pt was received getting out of bed and setting off bed alarm. Pt able to take step toward head of bed pt left in bed with all needs met.   Franki Monte  Mobility Specialist Please contact via Solicitor or Rehab office at [phone]

## 2022-05-27 NOTE — TOC Transition Note (Signed)
Transition of Care [name]) - CM/SW Discharge Note   Patient Details  Name: Gregory Lamb MRN: [id number] Date of Birth: Dec 13, 1933  Transition of [name]) CM/SW Contact:  Bary Castilla, LCSW Phone Number: 05/27/2022, 3:13 PM   Clinical Narrative:     Patient will DC to:?[name] Anticipated DC date:?05/27/2022 Family notified:Daughter- [name]  Transport by: Corey Harold   Per MD patient ready for DC to [name]. RN, patient, patient's family, and facility notified of DC. Discharge Summary faxed to facility. RN given number for report  [phone] ask for [name]. DC packet on chart. Ambulance transport requested for patient.  CSW signing off.   [name], [place] [phone]         Patient Goals and CMS Choice      Discharge Placement                         Discharge Plan and Services Additional resources added to the After Visit Summary for                                       Social Determinants of Health (SDOH) Interventions SDOH Screenings   Tobacco Use: Medium Risk (05/25/2022)     Readmission Risk Interventions   No data to display

## 2022-05-27 NOTE — Progress Notes (Addendum)
Rounding Note    Patient Name: Gregory Lamb Date of Encounter: 05/27/2022  [place] Cardiologist: Glenetta Hew, MD   Subjective   Delirious, requesting removal of his mitts, somewhat combative.  Inpatient Medications    Scheduled Meds:  albuterol  2 puff Inhalation Q6H   apixaban  5 mg Oral BID   vitamin C  500 mg Oral Daily   clopidogrel  75 mg Oral Daily   donepezil  10 mg Oral Daily   loxapine  5 mg Oral q AM   melatonin  3 mg Oral QHS   memantine  10 mg Oral BID   pantoprazole  40 mg Oral Daily   rosuvastatin  20 mg Oral Daily   sodium chloride flush  3 mL Intravenous Q12H   tamsulosin  0.4 mg Oral QPC supper   zinc sulfate  220 mg Oral Daily   Continuous Infusions:  PRN Meds: acetaminophen **OR** acetaminophen, chlorpheniramine-HYDROcodone, guaiFENesin-dextromethorphan   Vital Signs    Vitals:   05/26/22 1959 05/26/22 2143 05/27/22 0134 05/27/22 0538  BP: 133/66   107/64  Pulse:    (!) 43  Resp: 15   16  Temp: 98.8 F (37.1 C)   98.4 F (36.9 C)  TempSrc: Oral   Oral  SpO2: 98% 95% 95% 96%  Weight:    65.6 kg  Height:        Intake/Output Summary (Last 24 hours) at 05/27/2022 1121 Last data filed at 05/27/2022 0600 Gross per 24 hour  Intake --  Output 280 ml  Net -280 ml      05/27/2022    5:38 AM 05/26/2022    4:08 AM 05/25/2022    4:33 PM  Last 3 Weights  Weight (lbs) 144 lb 10 oz 150 lb 9.2 oz 143 lb 4.8 oz  Weight (kg) 65.6 kg 68.3 kg 65 kg      Telemetry    Sinus bradycardia - Personally Reviewed  ECG    No new - Personally Reviewed  Physical Exam   GEN: No acute distress.   Neck: No JVD Cardiac: regular rhythm, bradycardic Respiratory: Clear to auscultation bilaterally. GI: Soft, nontender, non-distended  MS: No edema; No deformity. Neuro:  Nonfocal  Psych: delirious, arguing.  Labs    High Sensitivity Troponin:   Recent Labs  Lab 05/25/22 0338 05/25/22 0548  TROPONINIHS 2,560* 2,580*      Chemistry Recent Labs  Lab 05/25/22 0338 05/26/22 0134  NA 144 141  K 4.1 3.6  CL 109 112*  CO2 25 24  GLUCOSE 165* 124*  BUN 29* 29*  CREATININE 1.42* 1.16  CALCIUM 9.9 9.0  GFRNONAA 48* >60  ANIONGAP 10 5    Lipids No results for input(s): "CHOL", "TRIG", "HDL", "LABVLDL", "LDLCALC", "CHOLHDL" in the last 168 hours.  Hematology Recent Labs  Lab 05/25/22 0338 05/26/22 0134 05/27/22 0145  WBC 11.3* 10.2 6.7  RBC 4.15* 3.67* 3.72*  HGB 12.9* 11.2* 11.6*  HCT 38.1* 34.7* 34.3*  MCV 91.8 94.6 92.2  MCH 31.1 30.5 31.2  MCHC 33.9 32.3 33.8  RDW 13.0 13.0 13.0  PLT 301 252 254   Thyroid No results for input(s): "TSH", "FREET4" in the last 168 hours.  BNP Recent Labs  Lab 05/25/22 0548  BNP 271.2*    DDimer  Recent Labs  Lab 05/25/22 0844  DDIMER <0.27     Radiology    No results found.  Cardiac Studies   None this admission  Patient Profile  88 y.o. male with covid 20 and type II NSTEMI  Assessment & Plan    NSTEMI - suspect Type II Demand on the back ground of suspected underlying disease - complicated by dementia: Given age and advanced dementia, decision was made not to take to the Cath Lab by prior team.  It does not appear it his dementia has markely improved, he is requesting mitts be remove and otherwise does not participate in the conversation. - heparin briefly utilized, he was resumed on eliquis. I had recommended 48 hours of heparin for NSTEMI.  - did not receive COVID 19 therapies.    Ischemic cardiomyopathy: 1st HB and bradycardia -  EF 40 to 45% following suspected anterior STEMI in March - holding BB and ARB - d/c BB and can resume ARB when able.  - back on eliquis, and on plavix.   LV thrombus:  - eliquis.   D/w primary service, will sign off as patient is anticipating discharge.     For questions or updates, please contact [place] Please consult [email or web address] for contact info under         Signed, Elouise Munroe, MD  05/27/2022, 11:21 AM

## 2022-05-27 NOTE — Discharge Summary (Addendum)
Physician Discharge Summary  Patient ID: Gregory Lamb MRN: [id number] DOB/AGE: 1933-06-14 88 y.o.  Admit date: 05/25/2022 Discharge date: 05/27/2022  Admission Diagnoses:  Discharge Diagnoses:  Principal Problem:   NSTEMI (non-ST elevated myocardial infarction) (HCC)(Type 2 MI) Active Problems:   BPH (benign prostatic hyperplasia)   DM2 (diabetes mellitus, type 2) (HCC)   Advanced dementia ([place])   Coronary artery disease involving native coronary artery of native heart with angina pectoris (HCC)   LV (left ventricular) mural thrombus   COVID-19 virus infection   AKI (acute kidney injury) (HCC)   Chronic anticoagulation   Heart failure with reduced ejection fraction ([place])   Essential hypertension   Discharged Condition: stable  Hospital Course: Patient is an 88 year old male past medical history significant for advanced dementia, coronary artery disease, hyperlipidemia, diabetes mellitus type 2 and BPH.  Patient was admitted with shortness of breath.  On presentation, initial troponin was 2580.  Troponin was trended and improved to 245.  Cardiology team was consulted to assist with patient's management.  Patient was briefly on heparin.  Subsequently, Eliquis was continued.  Procalcitonin was normal.  CRP was 1.1.  During the hospital stay, patient was noted to be significantly bradycardic, with heart rate in the 40s.  Cardiology team has advised discontinuing beta-blockers.  Patient will be discharged back to the skilled nursing facility.  NSTEMI CAD -See above documentation. -Patient has been cleared for discharge by the cardiology team.   -Follow-up cardiology team on discharge.  Covid-19 infection Patient had complained of shortness of breath and there have been reported an outbreak of COVID-19 at the patient's facility.  COVID-19 screening positive here.  Chest x-ray did not note any acute infiltrate.  O2 saturations currently maintained on room air.  Patient not a  candidate for Paxlovid due to being on Eliquis.  Molnupiravir no longer available in the inpatient setting and patient does not seem to be a candidate for remdesivir -Airborne precautions -Checked inflammatory markers -Albuterol inhaler -Vitamin C and zinc given during the hospital stay 05/26/2022: Inflammatory markers are not significantly elevated.  Will continue to manage supportively.  No shortness of breath today.  No fever.   Acute kidney injury -Patient presented with creatinine elevated up to 1.42 with BUN 29.  Baseline creatinine previously had been around 1.   -Resolved. -Suspected acute kidney injury is likely prerenal.     History of LV thrombus on chronic anticoagulation Noted to have LV thrombus back in March when presented with initial STEMI.  Patient had on Eliquis for treatment. -Resumed Eliquis as recommended by cardiology 05/26/2022: Continued anticoagulation.   Chronic heart failure with reduced EF On physical exam patient does not appear grossly fluid overloaded.  Last echocardiogram revealed EF to be 40-45% with normal diastolic parameters back in 08/2021.Chest x-ray noted borderline cardiomegaly with mild distended pulmonary vasculature. -Strict I&O's and daily weights 05/26/2022: Seemed compensated.  Cardiology input is appreciated.   Essential hypertension     Diabetes mellitus type 2, without long-term use of insulin Patient is diet controlled.  On admission glucose 165.  Last hemoglobin A1c 6.2 in 08/2021. -Heart healthy carb modified diet    Advanced dementia    BPH -Continue Flomax   Consults: cardiology  Significant Diagnostic Studies: Elevated troponin.  Positive COVID 19.  Discharge Exam: Blood pressure 107/64, pulse (!) 43, temperature 98.4 F (36.9 C), temperature source Oral, resp. rate 16, height '5\' 8"'$  (1.727 m), weight 65.6 kg, SpO2 96 %.   Disposition: Discharge disposition: 03-Skilled  Nursing Facility       Discharge Instructions      Diet - low sodium heart healthy   Complete by: As directed    Increase activity slowly   Complete by: As directed       Allergies as of 05/27/2022   No Known Allergies      Medication List     TAKE these medications    albuterol 108 (90 Base) MCG/ACT inhaler Commonly known as: VENTOLIN HFA Inhale 2 puffs into the lungs every 6 (six) hours.   apixaban 5 MG Tabs tablet Commonly known as: ELIQUIS Take 1 tablet (5 mg total) by mouth 2 (two) times daily.   clopidogrel 75 MG tablet Commonly known as: PLAVIX Take 1 tablet (75 mg total) by mouth daily.   donepezil 10 MG tablet Commonly known as: ARICEPT Take 1 tablet (10 mg total) by mouth daily. What changed: when to take this   losartan 50 MG tablet Commonly known as: COZAAR Take 1 tablet (50 mg total) by mouth daily. Takes 1/2 tablet daily   loxapine 5 MG capsule Commonly known as: LOXITANE Take 5 mg by mouth in the morning.   Melatonin 3 MG Caps Take 3 mg by mouth at bedtime.   memantine 10 MG tablet Commonly known as: NAMENDA Take 10 mg by mouth 2 (two) times daily. What changed: Another medication with the same name was removed. Continue taking this medication, and follow the directions you see here.   rosuvastatin 20 MG tablet Commonly known as: CRESTOR Take 1 tablet (20 mg total) by mouth daily. Start taking on: May 28, 2022 What changed:  medication strength how much to take   tamsulosin 0.4 MG Caps capsule Commonly known as: FLOMAX Take 1 capsule (0.4 mg total) by mouth daily after supper. What changed: when to take this         Signed: Bonnell Public 05/27/2022, 12:32 PM

## 2022-05-27 NOTE — Social Work (Addendum)
CSW was alerted of pt's pending discharge today. CSW called [facility]([name]) and spike with [employer]. Jamas Lav informed CSW to speak with pt's RN Chrys Racer. CSW attempted to speak with Chrys Racer however had to leave a voice mail.  2:30pm- CSW spoke with Chrys Racer and she stated that pt can return with a DC summary and fl2. Chrys Racer requested information to be faxed. CSW attempted to speak with pt's spouse however was unable to reach her. CSW spoke with pt's daughter [name] and confirmed the plan was for [facility] CSW notified her that pt would be discharging today with [facility] transporting.

## 2022-05-27 NOTE — NC FL2 (Signed)
[place] LEVEL OF CARE FORM     IDENTIFICATION  Patient Name: Gregory Lamb Birthdate: 08-15-1933 Sex: male Admission Date (Current Location): 05/25/2022  [facility] and [place] Number:  Herbalist and Address:  [facility], [place] [address] [place], [place] [address]      Provider Number: [id number]  Attending Physician Name and Address:  Bonnell Public, MD  Relative Name and Phone Number:  Roff [phone]    Current Level of Care: Hospital Recommended Level of Care: Memory Care Prior Approval Number:    Date Approved/Denied:   PASRR Number: [id number]  Discharge Plan: Other (Comment) (Memory Care)    Current Diagnoses: Patient Active Problem List   Diagnosis Date Noted   NSTEMI (non-ST elevated myocardial infarction) ([place]) 05/25/2022   COVID-19 virus infection 05/25/2022   AKI (acute kidney injury) ([place]) 05/25/2022   Chronic anticoagulation 05/25/2022   CAD (coronary artery disease) 05/25/2022   Heart failure with reduced ejection fraction ([place]) 05/25/2022   Essential hypertension 05/25/2022   Hypokalemia 08/25/2021   Lactic acidosis 08/25/2021   Advanced dementia ([place]) 08/25/2021   Coronary artery disease involving native coronary artery of native heart with angina pectoris ([place]) 08/25/2021   LV (left ventricular) mural thrombus 08/25/2021   Ischemic cardiomyopathy 08/25/2021   BPH (benign prostatic hyperplasia)    DM2 (diabetes mellitus, type 2) (HCC)    ST elevation myocardial infarction (STEMI) of anterior wall ([place]) 08/24/2021   Memory disorder 10/27/2019   Gait abnormality 10/27/2019    Orientation RESPIRATION BLADDER Height & Weight     Self  Normal Incontinent, External catheter Weight: 144 lb 10 oz (65.6 kg) Height:  '5\' 8"'$  (172.7 cm)  BEHAVIORAL SYMPTOMS/MOOD NEUROLOGICAL BOWEL NUTRITION STATUS      Continent Diet (See DC summary)  AMBULATORY STATUS COMMUNICATION OF NEEDS Skin   Limited  Assist Verbally Normal                       Personal Care Assistance Level of Assistance  Bathing, Feeding, Dressing Bathing Assistance: Limited assistance Feeding assistance: Independent Dressing Assistance: Limited assistance     Functional Limitations Info  Sight, Hearing Sight Info: Adequate Hearing Info: Impaired      SPECIAL CARE FACTORS FREQUENCY  PT (By licensed PT)     PT Frequency: Home Health, can be offered by facility              Contractures Contractures Info: Not present    Additional Factors Info  Code Status, Allergies Code Status Info: DNR Allergies Info: NKA           Current Medications (05/27/2022):  This is the current hospital active medication list Current Facility-Administered Medications  Medication Dose Route Frequency Provider Last Rate Last Admin   acetaminophen (TYLENOL) tablet 650 mg  650 mg Oral Q6H PRN Fuller Plan A, MD   650 mg at 05/26/22 2012   Or   acetaminophen (TYLENOL) suppository 650 mg  650 mg Rectal Q6H PRN Fuller Plan A, MD       albuterol (VENTOLIN HFA) 108 (90 Base) MCG/ACT inhaler 2 puff  2 puff Inhalation Q6H Smith, Rondell A, MD   2 puff at 05/27/22 1430   apixaban (ELIQUIS) tablet 5 mg  5 mg Oral BID Fuller Plan A, MD   5 mg at 05/27/22 0900   ascorbic acid (VITAMIN C) tablet 500 mg  500 mg Oral Daily Smith, Rondell A,  MD   500 mg at 05/27/22 0900   chlorpheniramine-HYDROcodone (TUSSIONEX) 10-8 MG/5ML suspension 5 mL  5 mL Oral Q12H PRN Fuller Plan A, MD       clopidogrel (PLAVIX) tablet 75 mg  75 mg Oral Daily Chandrasekhar, Mahesh A, MD   75 mg at 05/27/22 0900   donepezil (ARICEPT) tablet 10 mg  10 mg Oral Daily Smith, Rondell A, MD   10 mg at 05/27/22 0900   guaiFENesin-dextromethorphan (ROBITUSSIN DM) 100-10 MG/5ML syrup 10 mL  10 mL Oral Q4H PRN Fuller Plan A, MD       loxapine (LOXITANE) capsule 5 mg  5 mg Oral q AM Tamala Julian, Rondell A, MD   5 mg at 05/27/22 0617   melatonin tablet 3 mg  3  mg Oral QHS Smith, Rondell A, MD   3 mg at 05/26/22 2012   memantine (NAMENDA) tablet 10 mg  10 mg Oral BID Fuller Plan A, MD   10 mg at 05/27/22 0900   pantoprazole (PROTONIX) EC tablet 40 mg  40 mg Oral Daily Smith, Rondell A, MD   40 mg at 05/27/22 0900   rosuvastatin (CRESTOR) tablet 20 mg  20 mg Oral Daily Cherlynn Kaiser A, MD   20 mg at 05/27/22 0900   sodium chloride flush (NS) 0.9 % injection 3 mL  3 mL Intravenous Q12H Smith, Rondell A, MD   3 mL at 05/27/22 1157   tamsulosin (FLOMAX) capsule 0.4 mg  0.4 mg Oral QPC supper Fuller Plan A, MD   0.4 mg at 05/26/22 1706   zinc sulfate capsule 220 mg  220 mg Oral Daily Smith, Rondell A, MD   220 mg at 05/27/22 0900     Discharge Medications: Please see discharge summary for a list of discharge medications.  Relevant Imaging Results:  Relevant Lab Results:   Additional Information SSN# [id number] Bayard, Alicia

## 2022-06-08 ENCOUNTER — Telehealth: Payer: Self-pay | Admitting: Cardiology

## 2022-06-08 NOTE — Telephone Encounter (Signed)
Sarajane Jews, Callie E, PA-C  P Cv Div Nl Scheduling Patient was discharged from the hospital yesterday. Per Dr. Margaretann Loveless, he needs a follow-up visit in 2-4 weeks with Dr. Ellyn Hack or an APP. I was unable to get this arranged before the patient left the hospital. Can you please call the patient and help schedule this?  Hope you had a wonderful Christmas.  Thank you! [place] patient 3x to schedule appt, phone just keeps ringing and never goes to a VM.

## 2022-09-26 ENCOUNTER — Emergency Department (HOSPITAL_COMMUNITY)
Admission: EM | Admit: 2022-09-26 | Discharge: 2022-09-26 | Disposition: A | Discharge status: Skilled Nursing Facility | Payer: Medicare PPO | Attending: Emergency Medicine | Admitting: Emergency Medicine

## 2022-09-26 ENCOUNTER — Emergency Department (HOSPITAL_COMMUNITY): Payer: Medicare PPO

## 2022-09-26 ENCOUNTER — Other Ambulatory Visit: Payer: Self-pay

## 2022-09-26 DIAGNOSIS — N3001 Acute cystitis with hematuria: Secondary | ICD-10-CM | POA: Insufficient documentation

## 2022-09-26 DIAGNOSIS — F0390 Unspecified dementia without behavioral disturbance: Secondary | ICD-10-CM | POA: Diagnosis not present

## 2022-09-26 DIAGNOSIS — S0990XA Unspecified injury of head, initial encounter: Secondary | ICD-10-CM | POA: Diagnosis present

## 2022-09-26 DIAGNOSIS — W19XXXA Unspecified fall, initial encounter: Secondary | ICD-10-CM | POA: Insufficient documentation

## 2022-09-26 DIAGNOSIS — Y92129 Unspecified place in nursing home as the place of occurrence of the external cause: Secondary | ICD-10-CM | POA: Diagnosis not present

## 2022-09-26 DIAGNOSIS — Z7901 Long term (current) use of anticoagulants: Secondary | ICD-10-CM | POA: Diagnosis not present

## 2022-09-26 DIAGNOSIS — S0083XA Contusion of other part of head, initial encounter: Secondary | ICD-10-CM | POA: Insufficient documentation

## 2022-09-26 LAB — URINALYSIS, ROUTINE W REFLEX MICROSCOPIC
Bilirubin Urine: NEGATIVE
Glucose, UA: NEGATIVE mg/dL
Ketones, ur: NEGATIVE mg/dL
Nitrite: POSITIVE — AB
Protein, ur: 100 mg/dL — AB
RBC / HPF: >50 RBC/hpf (ref 0–5)
Specific Gravity, Urine: 1.024 (ref 1.005–1.030)
WBC, UA: >50 WBC/hpf (ref 0–5)
pH: 5 (ref 5.0–8.0)

## 2022-09-26 LAB — CBC WITH DIFFERENTIAL/PLATELET
Abs Immature Granulocytes: 0 10*3/uL (ref 0.00–0.07)
Basophils Absolute: 0 10*3/uL (ref 0.0–0.1)
Basophils Relative: 0 %
Eosinophils Absolute: 0 10*3/uL (ref 0.0–0.5)
Eosinophils Relative: 0 %
HCT: 38.1 % — ABNORMAL LOW (ref 39.0–52.0)
Hemoglobin: 12.7 g/dL — ABNORMAL LOW (ref 13.0–17.0)
Lymphocytes Relative: 9 %
Lymphs Abs: 2.3 10*3/uL (ref 0.7–4.0)
MCH: 30.7 pg (ref 26.0–34.0)
MCHC: 33.3 g/dL (ref 30.0–36.0)
MCV: 92 fL (ref 80.0–100.0)
Monocytes Absolute: 1.3 10*3/uL — ABNORMAL HIGH (ref 0.1–1.0)
Monocytes Relative: 5 %
Neutro Abs: 21.7 10*3/uL — ABNORMAL HIGH (ref 1.7–7.7)
Neutrophils Relative %: 86 %
Platelets: 243 10*3/uL (ref 150–400)
RBC: 4.14 MIL/uL — ABNORMAL LOW (ref 4.22–5.81)
RDW: 13.3 % (ref 11.5–15.5)
WBC: 25.2 10*3/uL — ABNORMAL HIGH (ref 4.0–10.5)
nRBC: 0 % (ref 0.0–0.2)
nRBC: 0 /100 WBC

## 2022-09-26 LAB — BASIC METABOLIC PANEL
Anion gap: 9 (ref 5–15)
BUN: 27 mg/dL — ABNORMAL HIGH (ref 8–23)
CO2: 22 mmol/L (ref 22–32)
Calcium: 8.9 mg/dL (ref 8.9–10.3)
Chloride: 109 mmol/L (ref 98–111)
Creatinine, Ser: 1.27 mg/dL — ABNORMAL HIGH (ref 0.61–1.24)
GFR, Estimated: 55 mL/min — ABNORMAL LOW (ref >60)
Glucose, Bld: 123 mg/dL — ABNORMAL HIGH (ref 70–99)
Potassium: 3.7 mmol/L (ref 3.5–5.1)
Sodium: 140 mmol/L (ref 135–145)

## 2022-09-26 LAB — LACTIC ACID, PLASMA: Lactic Acid, Venous: 1.2 mmol/L (ref 0.5–1.9)

## 2022-09-26 MED ORDER — CEPHALEXIN 500 MG PO CAPS: 500.0000 mg | ORAL_CAPSULE | Freq: Three times a day (TID) | ORAL | 0 refills | Status: AC

## 2022-09-26 MED ORDER — SODIUM CHLORIDE 0.9 % IV BOLUS
500.0000 mL | Freq: Once | INTRAVENOUS | Status: AC
  Administered 2022-09-26: 500 mL via INTRAVENOUS

## 2022-09-26 MED ORDER — IOHEXOL 350 MG/ML SOLN
75.0000 mL | Freq: Once | INTRAVENOUS | Status: AC | PRN
  Administered 2022-09-26: 75 mL via INTRAVENOUS

## 2022-09-26 MED ORDER — SODIUM CHLORIDE 0.9 % IV SOLN
1.0000 g | Freq: Once | INTRAVENOUS | Status: AC
  Administered 2022-09-26: 1 g via INTRAVENOUS
  Filled 2022-09-26: qty 10

## 2022-09-26 NOTE — ED Notes (Signed)
Patient transported to CT 

## 2022-09-26 NOTE — Discharge Instructions (Signed)
You were seen in the emergency department for evaluation of blood in your urine and frequent falls.  CAT scan of your head and abdomen and urinalysis that showed significant urinary tract infection.  You were given an IV dose of antibiotics.  We are sending you back to your facility with a prescription of antibiotics to continue.  Please follow-up with your regular doctor.  Return to the emergency department if any worsening or concerning symptoms

## 2022-09-26 NOTE — ED Provider Notes (Signed)
[place] EMERGENCY DEPARTMENT AT [facility] Provider Note   CSN: [id number] Arrival date & time: 09/26/22  1046     History  Chief Complaint  Patient presents with   Hematuria    Gregory Lamb is a 89 y.o. male.  Level 5 caveat secondary to dementia.  Per EMS they were called to nursing facility for episode of hematuria.  They were also told he had a fall although they do not know when that was.  He is on Eliquis.  Patient himself denies any complaints.  No family present at this time.  He has a signed DNR with him.  The history is provided by the patient and the EMS personnel.  Hematuria Pertinent negatives include no chest pain, no abdominal pain, no headaches and no shortness of breath. Nothing aggravates the symptoms. Nothing relieves the symptoms. He has tried nothing for the symptoms. The treatment provided no relief.       Home Medications Prior to Admission medications   Medication Sig Start Date End Date Taking? Authorizing Provider  albuterol (VENTOLIN HFA) 108 (90 Base) MCG/ACT inhaler Inhale 2 puffs into the lungs every 6 (six) hours. 05/27/22   Barnetta Chapel, MD  apixaban (ELIQUIS) 5 MG TABS tablet Take 1 tablet (5 mg total) by mouth 2 (two) times daily. 08/31/21   Erick Blinks, MD  clopidogrel (PLAVIX) 75 MG tablet Take 1 tablet (75 mg total) by mouth daily. 09/01/21   Erick Blinks, MD  donepezil (ARICEPT) 10 MG tablet Take 1 tablet (10 mg total) by mouth daily. Patient taking differently: Take 10 mg by mouth at bedtime. 10/27/19   York Spaniel, MD  losartan (COZAAR) 50 MG tablet Take 1 tablet (50 mg total) by mouth daily. Takes 1/2 tablet daily 08/31/21   Erick Blinks, MD  loxapine (LOXITANE) 5 MG capsule Take 5 mg by mouth in the morning.    [provider]  Melatonin 3 MG CAPS Take 3 mg by mouth at bedtime.    [provider]  memantine (NAMENDA) 10 MG tablet Take 10 mg by mouth 2 (two) times daily.    [provider]  rosuvastatin (CRESTOR) 20 MG tablet Take 1 tablet (20 mg total) by mouth daily. 05/28/22 07/27/22  Berton Mount I, MD  Tamsulosin HCl (FLOMAX) 0.4 MG CAPS Take 1 capsule (0.4 mg total) by mouth daily after supper. Patient taking differently: Take 0.4 mg by mouth daily. 12/13/11   Marcine Matar, MD      Allergies    Patient has no known allergies.    Review of Systems   Review of Systems  Unable to perform ROS: Dementia  Respiratory:  Negative for shortness of breath.   Cardiovascular:  Negative for chest pain.  Gastrointestinal:  Negative for abdominal pain.  Genitourinary:  Positive for hematuria.  Neurological:  Negative for headaches.    Physical Exam Updated Vital Signs BP 123/66 (BP Location: Right Arm)   Pulse 75   Temp 99.3 F (37.4 C) (Oral)   Resp (!) 24   Ht 5\' 8"  (1.727 m)   Wt 63.5 kg   SpO2 98%   BMI 21.29 kg/m  Physical Exam Vitals and nursing note reviewed.  Constitutional:      General: He is not in acute distress.    Appearance: Normal appearance. He is well-developed.  HENT:     Head: Normocephalic and atraumatic.     Comments: Possibly is a little bruising over his left cheek  and the left side of his head Eyes:     Conjunctiva/sclera: Conjunctivae normal.  Cardiovascular:     Rate and Rhythm: Normal rate and regular rhythm.     Heart sounds: No murmur heard. Pulmonary:     Effort: Pulmonary effort is normal. No respiratory distress.     Breath sounds: Normal breath sounds.  Abdominal:     Palpations: Abdomen is soft.     Tenderness: There is no abdominal tenderness. There is no guarding or rebound.  Musculoskeletal:        General: No deformity. Normal range of motion.     Cervical back: Neck supple.  Skin:    General: Skin is warm and dry.     Capillary Refill: Capillary refill takes less than 2 seconds.  Neurological:     General: No focal deficit present.     Mental Status: He is alert. Mental status is at  baseline. He is disoriented.     ED Results / Procedures / Treatments   Labs (all labs ordered are listed, but only abnormal results are displayed) Labs Reviewed  BASIC METABOLIC PANEL - Abnormal; Notable for the following components:      Result Value   Glucose, Bld 123 (*)    BUN 27 (*)    Creatinine, Ser 1.27 (*)    GFR, Estimated 55 (*)    All other components within normal limits  CBC WITH DIFFERENTIAL/PLATELET - Abnormal; Notable for the following components:   WBC 25.2 (*)    RBC 4.14 (*)    Hemoglobin 12.7 (*)    HCT 38.1 (*)    Neutro Abs 21.7 (*)    Monocytes Absolute 1.3 (*)    All other components within normal limits  URINALYSIS, ROUTINE W REFLEX MICROSCOPIC - Abnormal; Notable for the following components:   APPearance CLOUDY (*)    Hgb urine dipstick LARGE (*)    Protein, ur 100 (*)    Nitrite POSITIVE (*)    Leukocytes,Ua MODERATE (*)    Bacteria, UA MANY (*)    Non Squamous Epithelial 0-5 (*)    All other components within normal limits  CULTURE, BLOOD (ROUTINE X 2)  CULTURE, BLOOD (ROUTINE X 2)  URINE CULTURE  LACTIC ACID, PLASMA    EKG EKG Interpretation  Date/Time:  Wednesday September 26 2022 10:54:10 EDT Ventricular Rate:  67 PR Interval:  229 QRS Duration: 86 QT Interval:  397 QTC Calculation: 420 R Axis:   -41 Text Interpretation: Sinus rhythm Prolonged PR interval Inferior infarct, old Probable anterior infarct, age indeterminate No significant change since prior 12/23 Confirmed by Meridee Score [phone]) on 09/26/2022 10:55:19 AM  Radiology CT ABDOMEN PELVIS W CONTRAST  Result Date: 09/26/2022 CLINICAL DATA:  Unwitnessed fall. Anticoagulated. Abdominal pain, acute, nonlocalized. EXAM: CT ABDOMEN AND PELVIS WITH CONTRAST TECHNIQUE: Multidetector CT imaging of the abdomen and pelvis was performed using the standard protocol following bolus administration of intravenous contrast. RADIATION DOSE REDUCTION: This exam was performed according to the  departmental dose-optimization program which includes automated exposure control, adjustment of the mA and/or kV according to patient size and/or use of iterative reconstruction technique. CONTRAST:  75mL OMNIPAQUE IOHEXOL 350 MG/ML SOLN COMPARISON:  None Available. FINDINGS: Lower chest: Mild atelectasis or patchy dependent lower lobe pneumonia present in both lower lobes. No pleural fluid. No pneumothorax. Hepatobiliary: 1.8 cm low-density seen at the dome of the liver in the left lobe. This could be a hemangioma based on some early puddling enhancement. This is  a low suspicion finding. No calcified gallstones. Pancreas: Normal Spleen: Normal Adrenals/Urinary Tract: Adrenal glands are normal. Kidneys are normal except for small simple cysts, for which no further follow-up is recommended. Small area volume loss at the lower pole on the left. No hydronephrosis. Thick-walled bladder without focal lesion. Stomach/Bowel: Stomach and small intestine are normal. Normal appendix. There is diverticulosis of the left colon without visible diverticulitis. Low level diverticulitis can be inapparent at imaging. Vascular/Lymphatic: Aortic atherosclerosis. No aneurysm. IVC is normal. No adenopathy. Reproductive: Enlarged prostate gland. Numerous prostate concretions. Other: No free fluid or air.  No hernia. Musculoskeletal: Ordinary chronic lumbar degenerative changes. IMPRESSION: 1. No acute or traumatic finding. 2. Mild atelectasis or patchy dependent lower lobe pneumonia in both lower lobes. 3. 1.8 cm low-density at the dome of the liver in the left lobe. This could be a hemangioma based on some early puddling enhancement. This is a low suspicion finding. No follow-up recommended. 4. Aortic atherosclerosis. 5. Diverticulosis of the left colon without visible diverticulitis. Low level diverticulitis can be inapparent at imaging. 6. Enlarged prostate gland. Thick-walled bladder without focal lesion. Aortic Atherosclerosis  (ICD10-I70.0). Electronically Signed   By: Paulina Fusi M.D.   On: 09/26/2022 14:13   CT Head Wo Contrast  Result Date: 09/26/2022 CLINICAL DATA:  Fall on Eliquis EXAM: CT HEAD WITHOUT CONTRAST TECHNIQUE: Contiguous axial images were obtained from the base of the skull through the vertex without intravenous contrast. RADIATION DOSE REDUCTION: This exam was performed according to the departmental dose-optimization program which includes automated exposure control, adjustment of the mA and/or kV according to patient size and/or use of iterative reconstruction technique. COMPARISON:  08/24/2021 FINDINGS: Brain: No evidence of acute infarction, hemorrhage, mass, mass effect, or midline shift. No hydrocephalus or extra-axial fluid collection. Vascular: No hyperdense vessel. Skull: Negative for fracture or focal lesion. Sinuses/Orbits: Mucosal thickening in the ethmoid air cells. No acute finding in the orbits. Other: The mastoid air cells are well aerated. IMPRESSION: No acute intracranial process. Electronically Signed   By: Wiliam Ke M.D.   On: 09/26/2022 14:02   DG Chest Port 1 View  Result Date: 09/26/2022 CLINICAL DATA:  Weakness.  Hematuria.  Unwitnessed fall. EXAM: PORTABLE CHEST 1 VIEW COMPARISON:  Radiographs 05/25/2022 and 08/30/2021. FINDINGS: 1100 hours. Persistent low lung volumes with stable patchy bibasilar opacities, most consistent with atelectasis. The lungs are otherwise clear. There is no pleural effusion or pneumothorax. The heart size and mediastinal contours are stable with aortic atherosclerosis. No acute fractures are seen.  Telemetry leads overlie the chest. IMPRESSION: Stable chest with persistent low lung volumes and probable bibasilar atelectasis. No acute cardiopulmonary process. Electronically Signed   By: Carey Bullocks M.D.   On: 09/26/2022 11:18    Procedures Procedures    Medications Ordered in ED Medications  sodium chloride 0.9 % bolus 500 mL (0 mLs Intravenous  Stopped 09/26/22 1305)  cefTRIAXone (ROCEPHIN) 1 g in sodium chloride 0.9 % 100 mL IVPB (0 g Intravenous Stopped 09/26/22 1358)  iohexol (OMNIPAQUE) 350 MG/ML injection 75 mL (75 mLs Intravenous Contrast Given 09/26/22 1345)    ED Course/ Medical Decision Making/ A&P Clinical Course as of 09/26/22 1712  Wed Sep 26, 2022  1120 Chest x-ray interpreted by me as no acute infiltrate.  Awaiting radiology reading. [MB]    Clinical Course User Index [MB] Terrilee Files, MD  Medical Decision Making Amount and/or Complexity of Data Reviewed Labs: ordered. Radiology: ordered.  Risk Prescription drug management.   This patient complains of hematuria; this involves an extensive number of treatment Options and is a complaint that carries with it a high risk of complications and morbidity. The differential includes UTI, infection, prostatitis, renal colic, kidney injury  I ordered, reviewed and interpreted labs, which included CBC with elevated white count stable hemoglobin, chemistries with elevated renal function better than priors, urinalysis concerning for infection with red cells white cells bacteria and nitrite positive, lactate normal I ordered medication IV antibiotics and reviewed PMP when indicated. I ordered imaging studies which included CT head, CT abdomen and pelvis and I independently    visualized and interpreted imaging which showed bladder thickening and prostatic enlargement, diverticulosis, atelectasis versus pneumonia Additional history obtained from EMS Previous records obtained and reviewed in epic including prior ED visits, admitted in December for NSTEMI Cardiac monitoring reviewed, normal sinus rhythm Social determinants considered, no significant barriers Critical Interventions: None  After the interventions stated above, I reevaluated the patient and found patient to be resting comfortably although alert when awoken and denies any  complaints Admission and further testing considered, no indications for admission or further workup at this time.  Will cover with antibiotics back at facility.  Return instructions discussed.         Final Clinical Impression(s) / ED Diagnoses Final diagnoses:  Acute cystitis with hematuria    Rx / DC Orders ED Discharge Orders          Ordered    cephALEXin (KEFLEX) 500 MG capsule  3 times daily        09/26/22 1453              Terrilee Files, MD 09/26/22 1715

## 2022-09-26 NOTE — ED Triage Notes (Signed)
Pt BIB [facility] from [place] [facility] due to blood in urine.  Staff states he says he had an unwitnessed fall but cannot tell us when. Pt does have dementia and does take Eliquis.  VS BP 100/60, HR 65, SpO2 95%.  18g left hand.

## 2022-09-26 NOTE — ED Notes (Signed)
[facility] called, three ahead of patient.

## 2022-09-28 LAB — URINE CULTURE: Culture: >=100000 — AB

## 2022-09-29 ENCOUNTER — Telehealth (HOSPITAL_BASED_OUTPATIENT_CLINIC_OR_DEPARTMENT_OTHER): Payer: Self-pay | Admitting: *Deleted

## 2022-09-29 ENCOUNTER — Emergency Department (HOSPITAL_COMMUNITY): Payer: Medicare PPO

## 2022-09-29 ENCOUNTER — Encounter (HOSPITAL_COMMUNITY): Payer: Self-pay | Admitting: Emergency Medicine

## 2022-09-29 ENCOUNTER — Other Ambulatory Visit: Payer: Self-pay

## 2022-09-29 ENCOUNTER — Emergency Department (HOSPITAL_COMMUNITY)
Admission: EM | Admit: 2022-09-29 | Discharge: 2022-09-29 | Disposition: A | Discharge status: Skilled Nursing Facility | Payer: Medicare PPO | Attending: Emergency Medicine | Admitting: Emergency Medicine

## 2022-09-29 DIAGNOSIS — Z79899 Other long term (current) drug therapy: Secondary | ICD-10-CM | POA: Diagnosis not present

## 2022-09-29 DIAGNOSIS — F0390 Unspecified dementia without behavioral disturbance: Secondary | ICD-10-CM | POA: Insufficient documentation

## 2022-09-29 DIAGNOSIS — Z7901 Long term (current) use of anticoagulants: Secondary | ICD-10-CM | POA: Insufficient documentation

## 2022-09-29 DIAGNOSIS — R319 Hematuria, unspecified: Secondary | ICD-10-CM | POA: Diagnosis not present

## 2022-09-29 DIAGNOSIS — R5383 Other fatigue: Secondary | ICD-10-CM | POA: Insufficient documentation

## 2022-09-29 DIAGNOSIS — E119 Type 2 diabetes mellitus without complications: Secondary | ICD-10-CM | POA: Insufficient documentation

## 2022-09-29 DIAGNOSIS — R410 Disorientation, unspecified: Secondary | ICD-10-CM | POA: Insufficient documentation

## 2022-09-29 LAB — CBC WITH DIFFERENTIAL/PLATELET
Abs Immature Granulocytes: 0.05 10*3/uL (ref 0.00–0.07)
Basophils Absolute: 0.1 10*3/uL (ref 0.0–0.1)
Basophils Relative: 1 %
Eosinophils Absolute: 0.1 10*3/uL (ref 0.0–0.5)
Eosinophils Relative: 1 %
HCT: 36.5 % — ABNORMAL LOW (ref 39.0–52.0)
Hemoglobin: 12.1 g/dL — ABNORMAL LOW (ref 13.0–17.0)
Immature Granulocytes: 1 %
Lymphocytes Relative: 18 %
Lymphs Abs: 1.4 10*3/uL (ref 0.7–4.0)
MCH: 30 pg (ref 26.0–34.0)
MCHC: 33.2 g/dL (ref 30.0–36.0)
MCV: 90.3 fL (ref 80.0–100.0)
Monocytes Absolute: 0.8 10*3/uL (ref 0.1–1.0)
Monocytes Relative: 11 %
Neutro Abs: 5.3 10*3/uL (ref 1.7–7.7)
Neutrophils Relative %: 68 %
Platelets: 233 10*3/uL (ref 150–400)
RBC: 4.04 MIL/uL — ABNORMAL LOW (ref 4.22–5.81)
RDW: 12.7 % (ref 11.5–15.5)
WBC: 7.7 10*3/uL (ref 4.0–10.5)
nRBC: 0 % (ref 0.0–0.2)

## 2022-09-29 LAB — COMPREHENSIVE METABOLIC PANEL
ALT: 21 U/L (ref 0–44)
AST: 16 U/L (ref 15–41)
Albumin: 2.5 g/dL — ABNORMAL LOW (ref 3.5–5.0)
Alkaline Phosphatase: 41 U/L (ref 38–126)
Anion gap: 8 (ref 5–15)
BUN: 26 mg/dL — ABNORMAL HIGH (ref 8–23)
CO2: 19 mmol/L — ABNORMAL LOW (ref 22–32)
Calcium: 7.2 mg/dL — ABNORMAL LOW (ref 8.9–10.3)
Chloride: 117 mmol/L — ABNORMAL HIGH (ref 98–111)
Creatinine, Ser: 0.98 mg/dL (ref 0.61–1.24)
GFR, Estimated: >60 mL/min (ref >60)
Glucose, Bld: 98 mg/dL (ref 70–99)
Potassium: 2.8 mmol/L — ABNORMAL LOW (ref 3.5–5.1)
Sodium: 144 mmol/L (ref 135–145)
Total Bilirubin: 0.3 mg/dL (ref 0.3–1.2)
Total Protein: 5.3 g/dL — ABNORMAL LOW (ref 6.5–8.1)

## 2022-09-29 LAB — URINALYSIS, W/ REFLEX TO CULTURE (INFECTION SUSPECTED)
Bacteria, UA: NONE SEEN
Bilirubin Urine: NEGATIVE
Glucose, UA: NEGATIVE mg/dL
Ketones, ur: NEGATIVE mg/dL
Nitrite: NEGATIVE
Protein, ur: 100 mg/dL — AB
RBC / HPF: >50 RBC/hpf (ref 0–5)
Specific Gravity, Urine: 1.024 (ref 1.005–1.030)
pH: 5 (ref 5.0–8.0)

## 2022-09-29 LAB — LACTIC ACID, PLASMA: Lactic Acid, Venous: 1.4 mmol/L (ref 0.5–1.9)

## 2022-09-29 MED ORDER — POTASSIUM CHLORIDE 10 MEQ/100ML IV SOLN
10.0000 meq | INTRAVENOUS | Status: AC
  Administered 2022-09-29 (×2): 10 meq via INTRAVENOUS
  Filled 2022-09-29 (×3): qty 100

## 2022-09-29 MED ORDER — LORAZEPAM 2 MG/ML IJ SOLN
1.0000 mg | Freq: Once | INTRAMUSCULAR | Status: AC
  Administered 2022-09-29: 1 mg via INTRAVENOUS
  Filled 2022-09-29: qty 1

## 2022-09-29 MED ORDER — SODIUM CHLORIDE 0.9 % IV SOLN
1.0000 g | Freq: Once | INTRAVENOUS | Status: AC
  Administered 2022-09-29: 1 g via INTRAVENOUS
  Filled 2022-09-29: qty 10

## 2022-09-29 MED ORDER — POTASSIUM CHLORIDE CRYS ER 20 MEQ PO TBCR
40.0000 meq | EXTENDED_RELEASE_TABLET | Freq: Once | ORAL | Status: DC
  Filled 2022-09-29: qty 2

## 2022-09-29 NOTE — ED Provider Notes (Signed)
[place] EMERGENCY DEPARTMENT AT [facility] Provider Note   CSN: [id number] Arrival date & time: 09/29/22  1126     History  No chief complaint on file.   Gregory Lamb is a 89 y.o. male past medical history of BPH, diabetes, dementia, level 5 caveat presents today for evaluation of hematuria.  Per report from nursing staff patient is brought here from [facility] after continuing to have bright red blood in his urine which she has experienced since Wednesday.  Patient was seen at [facility] was taken off his Eliquis.  He is supposed to start his Eliquis tomorrow.  Patient is lethargic but he is at his baseline of ANO x 2. I spoke to [place] NT who took care of patient at [facility] through phone call.  She states that the patient continued to have blood in his urine in the last couple days.  States he has been lethargic since before and somewhat more after being seen in the ED a few days ago.  She denies any additional falls.  HPI    Past Medical History:  Diagnosis Date   BPH (benign prostatic hypertrophy)    Cataract    bil catarCTS REMOVED   Colon polyps    Diabetes mellitus    Gait abnormality 10/27/2019   Hernia    abdominal/ventral   Memory disorder 10/27/2019   Memory loss    Sleep apnea    last study 5 years ago- per patient [facility].  wears c_pap   Past Surgical History:  Procedure Laterality Date   COLONOSCOPY     EYE SURGERY     cataract extraction with IOL   hard of hearing     wear bil haring aids   LIPOMA EXCISION     back   POLYPECTOMY     TONSILLECTOMY     TRANSURETHRAL RESECTION OF PROSTATE  12/12/2011   Procedure: TRANSURETHRAL RESECTION OF THE PROSTATE WITH GYRUS INSTRUMENTS;  Surgeon: Marcine Matar, MD;  Location: [facility] ORS;  Service: Urology;  Laterality: N/A;   UPPER GASTROINTESTINAL ENDOSCOPY       Home Medications Prior to Admission medications   Medication Sig Start Date End Date Taking? Authorizing Provider  albuterol  (VENTOLIN HFA) 108 (90 Base) MCG/ACT inhaler Inhale 2 puffs into the lungs every 6 (six) hours. 05/27/22   Barnetta Chapel, MD  apixaban (ELIQUIS) 5 MG TABS tablet Take 1 tablet (5 mg total) by mouth 2 (two) times daily. Patient not taking: Reported on 09/26/2022 08/31/21   Erick Blinks, MD  cephALEXin (KEFLEX) 500 MG capsule Take 1 capsule (500 mg total) by mouth 3 (three) times daily. 09/26/22   Terrilee Files, MD  clopidogrel (PLAVIX) 75 MG tablet Take 1 tablet (75 mg total) by mouth daily. Patient not taking: Reported on 09/26/2022 09/01/21   Erick Blinks, MD  donepezil (ARICEPT) 10 MG tablet Take 1 tablet (10 mg total) by mouth daily. Patient not taking: Reported on 09/26/2022 10/27/19   York Spaniel, MD  donepezil (ARICEPT) 23 MG TABS tablet Take 23 mg by mouth at bedtime.    [provider]  ELIQUIS 2.5 MG TABS tablet Take 2.5 mg by mouth 2 (two) times daily. 09/15/22   [provider]  losartan (COZAAR) 50 MG tablet Take 1 tablet (50 mg total) by mouth daily. Takes 1/2 tablet daily Patient taking differently: Take 50 mg by mouth daily. 08/31/21   Erick Blinks, MD  loxapine (LOXITANE) 5 MG capsule Take  5 mg by mouth in the morning.    [provider]  Melatonin 3 MG CAPS Take 3 mg by mouth at bedtime.    [provider]  memantine (NAMENDA) 10 MG tablet Take 10 mg by mouth 2 (two) times daily.    [provider]  rosuvastatin (CRESTOR) 20 MG tablet Take 1 tablet (20 mg total) by mouth daily. 05/28/22 09/26/22  Berton Mount I, MD  Tamsulosin HCl (FLOMAX) 0.4 MG CAPS Take 1 capsule (0.4 mg total) by mouth daily after supper. Patient taking differently: Take 0.4 mg by mouth daily. 12/13/11   Marcine Matar, MD      Allergies    Patient has no known allergies.    Review of Systems   Review of Systems Negative except as per HPI.  Physical Exam Updated Vital Signs BP (!) 130/57 (BP Location: Right Arm)   Pulse (!) 50    Temp (!) 97.5 F (36.4 C) (Oral)   Resp 15   SpO2 98%  Physical Exam Vitals and nursing note reviewed.  Constitutional:      Appearance: Normal appearance.  HENT:     Head: Normocephalic and atraumatic.     Mouth/Throat:     Mouth: Mucous membranes are moist.  Eyes:     General: No scleral icterus. Cardiovascular:     Rate and Rhythm: Normal rate and regular rhythm.     Pulses: Normal pulses.     Heart sounds: Normal heart sounds.  Pulmonary:     Effort: Pulmonary effort is normal.     Breath sounds: Normal breath sounds.  Abdominal:     General: Abdomen is flat.     Palpations: Abdomen is soft.     Tenderness: There is no abdominal tenderness.  Musculoskeletal:        General: No deformity.  Skin:    General: Skin is warm.     Findings: No rash.  Neurological:     General: No focal deficit present.     Mental Status: He is alert. He is disoriented.  Psychiatric:        Mood and Affect: Mood normal.     ED Results / Procedures / Treatments   Labs (all labs ordered are listed, but only abnormal results are displayed) Labs Reviewed  CBC WITH DIFFERENTIAL/PLATELET  COMPREHENSIVE METABOLIC PANEL  URINALYSIS, ROUTINE W REFLEX MICROSCOPIC  URINALYSIS, W/ REFLEX TO CULTURE (INFECTION SUSPECTED)  LACTIC ACID, PLASMA  LACTIC ACID, PLASMA    EKG None  Radiology No results found.  Procedures Procedures    Medications Ordered in ED Medications - No data to display  ED Course/ Medical Decision Making/ A&P                             Medical Decision Making Amount and/or Complexity of Data Reviewed Labs: ordered. Radiology: ordered.  Risk Prescription drug management.   This patient presents to the ED for hematuria, this involves an extensive number of treatment options, and is a complaint that carries with a high risk of complications and morbidity.  The differential diagnosis includes UTI, infected stone, pyelonephritis, trauma, BPH.  This is not an  exhaustive list.  Lab tests: I ordered and personally interpreted labs.  The pertinent results include: WBC unremarkable. Hbg unremarkable. Platelets unremarkable. Electrolytes unremarkable. BUN, creatinine unremarkable.   Imaging studies: I ordered imaging studies. I personally reviewed, interpreted imaging and agree with the radiologist's interpretations. The results include:  Chest x-ray showed no acute abnormalities.  Problem list/ ED course/ Critical interventions/ Medical management: HPI: See above Vital signs within normal range and stable throughout visit. Laboratory/imaging studies significant for: See above. On physical examination, patient is afebrile and appears in no acute distress. This patient presents with hematuria in the last week. Urinalysis today showed trace leukocytes and some amount of blood. CMP with evidence of hypokalemia. IV K given.  CBC with no evidence of leukocytosis, improved from a white count of 25 resulted 4 days ago.  I have low suspicion for sepsis.  Patient does not meet SIRS criteria.  Culture blood drawn 3 days ago with no bacterial growth. Patient mentating at his baseline.  Ct scan 4 days ago was negative for any intracranial bleed. He is somewhat agitated and tried to climb out of bed. I suspect his agitation is secondary to his history of dementia as his mentation has remained at his baseline since last CT scan. Low suspicion for intracranial bleed given no additional fall, Eliquis has been temporarily paused. Ativan and waist belt ordered for patient's safety.  Plan is to discharge home with PCP and urology follow up. I discussed case with the attending Dr. Denton Lank who agrees with the plan. I have reviewed the patient home medicines and have made adjustments as needed.  Cardiac monitoring/EKG: The patient was maintained on a cardiac monitor.  I personally reviewed and interpreted the cardiac monitor which showed an underlying rhythm of: sinus  rhythm.  Additional history obtained: External records from outside source obtained and reviewed including: Chart review including previous notes, labs, imaging.  Consultations obtained:  Disposition Continued outpatient therapy. Follow-up with PCP and urology recommended for reevaluation of symptoms. Treatment plan discussed with patient.  Pt acknowledged understanding was agreeable to the plan. Worrisome signs and symptoms were discussed with patient, and patient acknowledged understanding to return to the ED if they noticed these signs and symptoms. Patient was stable upon discharge.   This chart was dictated using voice recognition software.  Despite best efforts to proofread,  errors can occur which can change the documentation meaning.          Final Clinical Impression(s) / ED Diagnoses Final diagnoses:  Hematuria, unspecified type    Rx / DC Orders ED Discharge Orders     None         Jeanelle Malling, PA 09/30/22 0272    Cathren Laine, MD 10/01/22 1416

## 2022-09-29 NOTE — ED Notes (Signed)
Attempted to call report to [facility] staff

## 2022-09-29 NOTE — Telephone Encounter (Signed)
Post ED Visit - Positive Culture Follow-up  Culture report reviewed by antimicrobial stewardship pharmacist: [facility] []  Enzo Bi, Pharm.D. []  Celedonio Miyamoto, [address].D., BCPS AQ-ID []  Garvin Fila, Pharm.D., BCPS []  Georgina Pillion, [address].D., BCPS []  [place], [place].D., BCPS, AAHIVP []  Estella Husk, Pharm.D., BCPS, AAHIVP []  Lysle Pearl, PharmD, BCPS []  Phillips Climes, PharmD, BCPS []  Agapito Games, PharmD, BCPS []  Verlan Friends, PharmD []  Mervyn Gay, PharmD, BCPS [x]  Riccardo Dubin, PharmD  [facility] []  Len Childs, PharmD []  Greer Pickerel, PharmD []  Adalberto Cole, PharmD []  Perlie Gold, Rph []  Lonell Face) Jean Rosenthal, PharmD []  Earl Many, PharmD []  Junita Push, PharmD []  Dorna Leitz, PharmD []  Terrilee Files, PharmD []  Lynann Beaver, PharmD []  Keturah Barre, PharmD []  Loralee Pacas, PharmD []  Bernadene Person, PharmD   Positive urine culture Treated with Cephalexin, organism sensitive to the same and no further patient follow-up is required at this time.  Gregory Lamb 09/29/2022, 12:33 PM

## 2022-09-29 NOTE — ED Triage Notes (Signed)
The patient presents from [facility] due to bright red blood in urine which he has experienced since Wednesday. He was seen at [facility] and was taken off of his Eliquis. He is supposed to restart his medication tomorrow. Patient is lethargic, but is at his baseline of a&O x2.      EMS vitals 126/80 BP 70 HR 20 RR 97% SPO2 on room air 157 CBG

## 2022-09-29 NOTE — Discharge Instructions (Addendum)
Please continue to take your antibiotics as prescribed. I recommend close follow-up with PCP and urology for reevaluation.  Please do not hesitate to return to emergency department if worrisome signs symptoms we discussed become apparent.

## 2022-09-29 NOTE — ED Notes (Signed)
[facility] called for transport 

## 2022-09-30 LAB — URINE CULTURE: Culture: <10000 — AB

## 2022-10-01 LAB — CULTURE, BLOOD (ROUTINE X 2): Culture: NO GROWTH

## 2022-12-12 ENCOUNTER — Encounter (HOSPITAL_COMMUNITY): Payer: Self-pay

## 2022-12-12 ENCOUNTER — Emergency Department (HOSPITAL_COMMUNITY): Payer: Medicare PPO

## 2022-12-12 ENCOUNTER — Emergency Department (HOSPITAL_COMMUNITY)
Admission: EM | Admit: 2022-12-12 | Discharge: 2022-12-12 | Disposition: A | Discharge status: Home / Self Care | Payer: Medicare PPO | Attending: Emergency Medicine | Admitting: Emergency Medicine

## 2022-12-12 DIAGNOSIS — Z7901 Long term (current) use of anticoagulants: Secondary | ICD-10-CM | POA: Insufficient documentation

## 2022-12-12 DIAGNOSIS — S0990XA Unspecified injury of head, initial encounter: Secondary | ICD-10-CM | POA: Diagnosis not present

## 2022-12-12 DIAGNOSIS — W19XXXA Unspecified fall, initial encounter: Secondary | ICD-10-CM | POA: Insufficient documentation

## 2022-12-12 NOTE — ED Provider Notes (Signed)
[place] EMERGENCY DEPARTMENT AT [facility] Provider Note   CSN: [id number] Arrival date & time: 12/12/22  2217     History  Chief Complaint  Patient presents with   Gregory Lamb is a 89 y.o. male.  89 yo M with a chief complaints of fall.  The patient tells me that he was taking a shower and thinks he just fell.  Denies loss of consciousness.  Complaining of head injury but denies any other pain post fall.  Denies pain to the chest the abdomen the pelvis the neck back.  Denies extremity pain.  He feels like otherwise he has been doing well.   Fall       Home Medications Prior to Admission medications   Medication Sig Start Date End Date Taking? Authorizing Provider  albuterol (VENTOLIN HFA) 108 (90 Base) MCG/ACT inhaler Inhale 2 puffs into the lungs every 6 (six) hours. 05/27/22   Barnetta Chapel, MD  apixaban (ELIQUIS) 5 MG TABS tablet Take 1 tablet (5 mg total) by mouth 2 (two) times daily. Patient not taking: Reported on 09/26/2022 08/31/21   Erick Blinks, MD  cephALEXin (KEFLEX) 500 MG capsule Take 1 capsule (500 mg total) by mouth 3 (three) times daily. Patient not taking: Reported on 09/29/2022 09/26/22   Terrilee Files, MD  clopidogrel (PLAVIX) 75 MG tablet Take 1 tablet (75 mg total) by mouth daily. Patient not taking: Reported on 09/26/2022 09/01/21   Erick Blinks, MD  donepezil (ARICEPT) 10 MG tablet Take 1 tablet (10 mg total) by mouth daily. Patient not taking: Reported on 09/26/2022 10/27/19   York Spaniel, MD  donepezil (ARICEPT) 23 MG TABS tablet Take 23 mg by mouth at bedtime.    [provider]  ELIQUIS 2.5 MG TABS tablet Take 2.5 mg by mouth 2 (two) times daily. 09/15/22   [provider]  losartan (COZAAR) 50 MG tablet Take 1 tablet (50 mg total) by mouth daily. Takes 1/2 tablet daily Patient taking differently: Take 50 mg by mouth daily. 08/31/21   Erick Blinks, MD  loxapine (LOXITANE) 5 MG capsule  Take 5 mg by mouth in the morning.    [provider]  Melatonin 3 MG CAPS Take 3 mg by mouth at bedtime.    [provider]  memantine (NAMENDA) 10 MG tablet Take 10 mg by mouth 2 (two) times daily.    [provider]  rosuvastatin (CRESTOR) 20 MG tablet Take 1 tablet (20 mg total) by mouth daily. 05/28/22 09/29/22  Berton Mount I, MD  Tamsulosin HCl (FLOMAX) 0.4 MG CAPS Take 1 capsule (0.4 mg total) by mouth daily after supper. Patient taking differently: Take 0.4 mg by mouth daily. 12/13/11   Marcine Matar, MD      Allergies    Patient has no known allergies.    Review of Systems   Review of Systems  Physical Exam Updated Vital Signs BP 130/70 (BP Location: Left Arm)   Pulse 72   Temp 97.8 F (36.6 C) (Oral)   Resp 18   Wt 83 kg   SpO2 97%   BMI 27.83 kg/m  Physical Exam Vitals and nursing note reviewed.  Constitutional:      Appearance: He is well-developed.  HENT:     Head: Normocephalic.     Comments: Right frontal hematoma. Eyes:     Pupils: Pupils are equal, round, and reactive to light.  Neck:     Vascular: No  JVD.  Cardiovascular:     Rate and Rhythm: Normal rate and regular rhythm.     Heart sounds: No murmur heard.    No friction rub. No gallop.  Pulmonary:     Effort: No respiratory distress.     Breath sounds: No wheezing.  Abdominal:     General: There is no distension.     Tenderness: There is no abdominal tenderness. There is no guarding or rebound.  Musculoskeletal:        General: Normal range of motion.     Cervical back: Normal range of motion and neck supple.  Skin:    Coloration: Skin is not pale.     Findings: No rash.  Neurological:     Mental Status: He is alert and oriented to person, place, and time.  Psychiatric:        Behavior: Behavior normal.     ED Results / Procedures / Treatments   Labs (all labs ordered are listed, but only abnormal results are displayed) Labs Reviewed - No data to  display  EKG EKG Interpretation Date/Time:  Wednesday December 12 2022 22:22:47 EDT Ventricular Rate:  66 PR Interval:  279 QRS Duration:  98 QT Interval:  392 QTC Calculation: 411 R Axis:   47  Text Interpretation: Sinus or ectopic atrial rhythm Prolonged PR interval Probable inferior infarct, old Probable anterior infarct, age indeterminate Lateral leads are also involved Baseline wander TECHNICALLY DIFFICULT Otherwise no significant change Confirmed by Melene Plan [id number]) on 12/12/2022 10:43:08 PM  Radiology CT Cervical Spine Wo Contrast  Result Date: 12/12/2022 CLINICAL DATA:  Unwitnessed fall. EXAM: CT CERVICAL SPINE WITHOUT CONTRAST TECHNIQUE: Multidetector CT imaging of the cervical spine was performed without intravenous contrast. Multiplanar CT image reconstructions were also generated. RADIATION DOSE REDUCTION: This exam was performed according to the departmental dose-optimization program which includes automated exposure control, adjustment of the mA and/or kV according to patient size and/or use of iterative reconstruction technique. COMPARISON:  None Available. FINDINGS: Alignment: Normal. Skull base and vertebrae: No acute fracture. No primary bone lesion or focal pathologic process. Soft tissues and spinal canal: No prevertebral fluid or swelling. No visible canal hematoma. Disc levels: Marked severity endplate sclerosis, marked severity anterior osteophyte formation and moderate to marked severity posterior bony spurring are seen at the levels of C4-C5, C5-C6 and C6-C7. Moderate severity changes are seen at the levels of C2-C3 and C3-C4. There is marked severity narrowing of the anterior atlantoaxial articulation. Marked severity intervertebral disc space narrowing is seen at the levels of C4-C5, C5-C6 and C6-C7. Bilateral marked severity multilevel facet joint hypertrophy is noted. Upper chest: Negative. Other: None. IMPRESSION: Marked severity multilevel degenerative changes, as  described above, without evidence of an acute fracture or subluxation. Electronically Signed   By: Aram Candela M.D.   On: 12/12/2022 22:44   CT Head Wo Contrast  Result Date: 12/12/2022 CLINICAL DATA:  Unwitnessed fall. EXAM: CT HEAD WITHOUT CONTRAST TECHNIQUE: Contiguous axial images were obtained from the base of the skull through the vertex without intravenous contrast. RADIATION DOSE REDUCTION: This exam was performed according to the departmental dose-optimization program which includes automated exposure control, adjustment of the mA and/or kV according to patient size and/or use of iterative reconstruction technique. COMPARISON:  September 26, 2022 FINDINGS: Brain: There is mild to moderate severity cerebral atrophy with widening of the extra-axial spaces and ventricular dilatation. There are areas of decreased attenuation within the white matter tracts of the supratentorial brain, consistent with microvascular  disease changes. Vascular: No hyperdense vessel or unexpected calcification. Skull: Normal. Negative for fracture or focal lesion. Sinuses/Orbits: No acute finding. Other: There is mild right frontal scalp soft tissue swelling. IMPRESSION: 1. Generalized cerebral atrophy with chronic white matter small vessel ischemic changes. 2. Right frontal scalp soft tissue swelling without evidence of an acute intracranial abnormality. Electronically Signed   By: Aram Candela M.D.   On: 12/12/2022 22:41    Procedures Procedures    Medications Ordered in ED Medications - No data to display  ED Course/ Medical Decision Making/ A&P                             Medical Decision Making Amount and/or Complexity of Data Reviewed Radiology: ordered.   89 yo M with a cc of a fall.  This occurred in the shower.  He was found in his bed afterwards by the nursing facility.  Per EMS they were not sure exactly how he fell.  Patient seems to think that he lost his balance.  Will obtain a CT head of  the head and C-spine.  Will hold off on lab work at this time.  He has no obvious chest or pelvis symptoms we will hold off on plain films.  CT of the head independently interpreted by me without intracranial hemorrhage.  CT of the C-spine negative for acute fracture.  Will discharge patient home.  PCP follow-up.  10:50 PM:  I have discussed the diagnosis/risks/treatment options with the patient.  Evaluation and diagnostic testing in the emergency department does not suggest an emergent condition requiring admission or immediate intervention beyond what has been performed at this time.  They will follow up with PCP. We also discussed returning to the ED immediately if new or worsening sx occur. We discussed the sx which are most concerning (e.g., sudden worsening pain, fever, inability to tolerate by mouth) that necessitate immediate return. Medications administered to the patient during their visit and any new prescriptions provided to the patient are listed below.  Medications given during this visit Medications - No data to display   The patient appears reasonably screen and/or stabilized for discharge and I doubt any other medical condition or other [facility] requiring further screening, evaluation, or treatment in the ED at this time prior to discharge.          Final Clinical Impression(s) / ED Diagnoses Final diagnoses:  Fall, initial encounter    Rx / DC Orders ED Discharge Orders     None         Melene Plan, DO 12/12/22 2250

## 2022-12-12 NOTE — ED Notes (Signed)
Trauma Response Nurse Documentation   Gregory Lamb is a 89 y.o. male arriving to [facility] ED via EMS  On Eliquis (apixaban) daily. Trauma was activated as a Level 2 by ED charge RN based on the following trauma criteria Elderly patients > 65 with head trauma on anti-coagulation (excluding ASA).  Patient cleared for CT by Dr. Adela Lank EDP. Pt transported to CT with trauma response nurse present to monitor. RN remained with the patient throughout their absence from the department for clinical observation.   GCS 14-appears at baseline. 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  wears c_pap     Past Surgical History:  Procedure Laterality Date   COLONOSCOPY     EYE SURGERY     cataract extraction with IOL   hard of hearing     wear bil haring aids   LIPOMA EXCISION     back   POLYPECTOMY     TONSILLECTOMY     TRANSURETHRAL RESECTION OF PROSTATE  12/12/2011   Procedure: TRANSURETHRAL RESECTION OF THE PROSTATE WITH GYRUS INSTRUMENTS;  Surgeon: Marcine Matar, MD;  Location: [facility] ORS;  Service: Urology;  Laterality: N/A;   UPPER GASTROINTESTINAL ENDOSCOPY         Initial Focused Assessment (If applicable, or please see trauma documentation): Alert/confused male presents via EMS from SNF, unwitnessed fall in shower with right forehead injury.   Airway patent/unobstructed, BS clear No obvious uncontrolled hemorrhage, bruising to right forehead, right posterior elbow and bilateral knees GCS 14 PERRLA sluggish 2  CT's Completed:   CT Head and CT C-Spine   Interventions:  Trauma lab draw CT head and cervical spine XRAYS deferred by EDP Adela Lank  Plan for disposition:  Discharge home    Consults completed:  none   Event Summary: Unwitnessed fall in shower, pt put himself back in bed. Obvious trauma to forehead, right elbow and bilateral knees. Scans negative, discharge back to facility.   Bedside handoff with ED RN Renae Fickle.    Stachia Slutsky O Bernetha Anschutz  Trauma Response RN  Please call TRN at [phone] for further assistance.

## 2022-12-12 NOTE — ED Triage Notes (Signed)
BIB EMS/ unwitnessed fall @ SNF/ on eliquis/ denies LOC/ hematoma on forehead/ no other complaints

## 2022-12-12 NOTE — Discharge Instructions (Signed)
Your CT scan did not show any bleeding inside your skull.  Follow-up with your family doctor in the office.  Please return for worsening headache confusion or vomiting.

## 2023-09-03 DEATH — deceased
